# Patient Record
Sex: Male | Born: 1995 | Race: Black or African American | Hispanic: No | Marital: Single | Smoking: Never smoker
Health system: Southern US, Community
[De-identification: ages and names within clinical notes are randomized; demographics above are authoritative.]

## PROBLEM LIST (undated history)

## (undated) HISTORY — PX: ADENOIDECTOMY: SUR15

## (undated) HISTORY — PX: TONSILLECTOMY: SUR1361

---
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
## 2013-03-06 ENCOUNTER — Encounter (HOSPITAL_COMMUNITY): Payer: Self-pay | Admitting: Emergency Medicine

## 2013-03-06 ENCOUNTER — Emergency Department (HOSPITAL_COMMUNITY): Payer: Self-pay

## 2013-03-06 ENCOUNTER — Emergency Department (INDEPENDENT_AMBULATORY_CARE_PROVIDER_SITE_OTHER)
Admission: EM | Admit: 2013-03-06 | Discharge: 2013-03-06 | Disposition: A | Discharge status: Nursing Home (Includes ICF) | Payer: Self-pay | Source: Home / Self Care

## 2013-03-06 ENCOUNTER — Emergency Department (HOSPITAL_COMMUNITY)
Admission: EM | Admit: 2013-03-06 | Discharge: 2013-03-06 | Disposition: A | Discharge status: Home / Self Care | Payer: Self-pay | Attending: Emergency Medicine | Admitting: Emergency Medicine

## 2013-03-06 ENCOUNTER — Emergency Department (INDEPENDENT_AMBULATORY_CARE_PROVIDER_SITE_OTHER): Payer: Self-pay

## 2013-03-06 ENCOUNTER — Encounter (HOSPITAL_COMMUNITY): Payer: Self-pay

## 2013-03-06 DIAGNOSIS — R599 Enlarged lymph nodes, unspecified: Secondary | ICD-10-CM | POA: Insufficient documentation

## 2013-03-06 DIAGNOSIS — J0510 Acute epiglottitis without obstruction: Secondary | ICD-10-CM

## 2013-03-06 DIAGNOSIS — J029 Acute pharyngitis, unspecified: Secondary | ICD-10-CM

## 2013-03-06 DIAGNOSIS — IMO0001 Reserved for inherently not codable concepts without codable children: Secondary | ICD-10-CM | POA: Insufficient documentation

## 2013-03-06 DIAGNOSIS — Z9089 Acquired absence of other organs: Secondary | ICD-10-CM | POA: Insufficient documentation

## 2013-03-06 DIAGNOSIS — R5381 Other malaise: Secondary | ICD-10-CM | POA: Insufficient documentation

## 2013-03-06 DIAGNOSIS — R498 Other voice and resonance disorders: Secondary | ICD-10-CM | POA: Insufficient documentation

## 2013-03-06 DIAGNOSIS — R509 Fever, unspecified: Secondary | ICD-10-CM | POA: Insufficient documentation

## 2013-03-06 DIAGNOSIS — R5383 Other fatigue: Secondary | ICD-10-CM | POA: Insufficient documentation

## 2013-03-06 DIAGNOSIS — R1310 Dysphagia, unspecified: Secondary | ICD-10-CM | POA: Insufficient documentation

## 2013-03-06 LAB — CBC WITH DIFFERENTIAL/PLATELET
Basophils Absolute: 0 10*3/uL (ref 0.0–0.1)
Basophils Relative: 0 % (ref 0–1)
Eosinophils Absolute: 0.2 10*3/uL (ref 0.0–0.7)
MCH: 29.1 pg (ref 26.0–34.0)
MCHC: 35.5 g/dL (ref 30.0–36.0)
Neutrophils Relative %: 66 % (ref 43–77)
Platelets: 248 10*3/uL (ref 150–400)

## 2013-03-06 LAB — COMPREHENSIVE METABOLIC PANEL
ALT: 13 U/L (ref 0–53)
AST: 19 U/L (ref 0–37)
Albumin: 4.6 g/dL (ref 3.5–5.2)
Alkaline Phosphatase: 79 U/L (ref 39–117)
Potassium: 3.9 mEq/L (ref 3.5–5.1)
Sodium: 139 mEq/L (ref 135–145)
Total Protein: 9.3 g/dL — ABNORMAL HIGH (ref 6.0–8.3)

## 2013-03-06 LAB — POCT RAPID STREP A: Streptococcus, Group A Screen (Direct): NEGATIVE

## 2013-03-06 MED ORDER — SODIUM CHLORIDE 0.9 % IV BOLUS (SEPSIS)
1000.0000 mL | Freq: Once | INTRAVENOUS | Status: AC
  Administered 2013-03-06: 1000 mL via INTRAVENOUS

## 2013-03-06 MED ORDER — IOHEXOL 300 MG/ML  SOLN
75.0000 mL | Freq: Once | INTRAMUSCULAR | Status: AC | PRN
  Administered 2013-03-06: 75 mL via INTRAVENOUS

## 2013-03-06 MED ORDER — HYDROCODONE-ACETAMINOPHEN 7.5-500 MG/15ML PO SOLN: 15.0000 mL | Freq: Four times a day (QID) | ORAL | Status: DC | PRN

## 2013-03-06 MED ORDER — CLINDAMYCIN HCL 300 MG PO CAPS: 300.0000 mg | ORAL_CAPSULE | Freq: Three times a day (TID) | ORAL | Status: DC

## 2013-03-06 MED ORDER — SODIUM CHLORIDE 0.9 % IV SOLN
3.0000 g | Freq: Once | INTRAVENOUS | Status: AC
  Administered 2013-03-06: 3 g via INTRAVENOUS
  Filled 2013-03-06: qty 3

## 2013-03-06 NOTE — ED Notes (Signed)
Pt is c/o sore throat x2 weeks Sx include: dysphagia, fever Denies: v/n/d Went to [facility]; strep test negative; given Lidocaine 2% viscous Also took tyle last night  He is alert w/no signs of acute distress.

## 2013-03-06 NOTE — ED Provider Notes (Signed)
History     CSN: [id number]  Arrival date & time 03/06/13  1226   First MD Initiated Contact with Patient 03/06/13 1504      Chief Complaint  Patient presents with  . Sore Throat    (Consider location/radiation/quality/duration/timing/severity/associated sxs/prior treatment) HPI Comments: Patient presents with a two-week history of worsening sore throat. He states it's gotten markedly worse over the last 2 days. He states at this point, he has difficulty controlling his secretions. He states it hurts when he swallows. He denies any shortness of breath. He's had some subjective fevers at home. He denies any nausea vomiting or diarrhea. He was seen in a [facility] earlier when it started and had a negative rapid strep. He was also seen that urgent care Center today and had a soft tissue neck that was positive for possible epiglottitis. He was sent over here for further evaluation. He denies any runny nose congestion or chest congestion. He denies any rashes.  Patient is a 17 y.o. male presenting with pharyngitis.  Sore Throat Pertinent negatives include no chest pain, no abdominal pain, no headaches and no shortness of breath.    History reviewed. No pertinent past medical history.  Past Surgical History  Procedure Laterality Date  . Tonsillectomy    . Adenoidectomy      History reviewed. No pertinent family history.  History  Substance Use Topics  . Smoking status: Never Smoker   . Smokeless tobacco: Not on file  . Alcohol Use: No      Review of Systems  Constitutional: Positive for fever, chills and fatigue. Negative for diaphoresis.  HENT: Positive for sore throat, trouble swallowing and voice change. Negative for congestion, rhinorrhea and sneezing.   Eyes: Negative.   Respiratory: Negative for cough, chest tightness and shortness of breath.   Cardiovascular: Negative for chest pain and leg swelling.  Gastrointestinal: Negative for nausea, vomiting, abdominal pain,  diarrhea and blood in stool.  Genitourinary: Negative for frequency, hematuria, flank pain and difficulty urinating.  Musculoskeletal: Positive for myalgias. Negative for back pain and arthralgias.  Skin: Negative for rash.  Neurological: Negative for dizziness, speech difficulty, weakness, numbness and headaches.    Allergies  Review of patient's allergies indicates no known allergies.  Home Medications   Current Outpatient Rx  Name  Route  Sig  Dispense  Refill  . acetaminophen (TYLENOL) 500 MG tablet   Oral   Take 1,000 mg by mouth every 4 (four) hours as needed for pain.         . clindamycin (CLEOCIN) 300 MG capsule   Oral   Take 1 capsule (300 mg total) by mouth 3 (three) times daily. X 7 days   30 capsule   0   . HYDROcodone-acetaminophen (LORTAB) 7.5-500 MG/15ML solution   Oral   Take 15 mLs by mouth every 6 (six) hours as needed for pain.   120 mL   0     BP 123/71  Pulse 98  Temp(Src) 100.1 F (37.8 C) (Oral)  Resp 16  SpO2 98%  Physical Exam  Constitutional: He is oriented to person, place, and time. He appears well-developed and well-nourished.  HENT:  Head: Normocephalic and atraumatic.  Right Ear: External ear normal.  Left Ear: External ear normal.  Nose: Nose normal.  Patient with erythema to the posterior pharynx. The uvula is midline however there is some fullness to the right peritonsillar area as compared to the left. There is no significant trismus.  Eyes: Pupils  are equal, round, and reactive to light.  Neck: Normal range of motion. Neck supple.  Cardiovascular: Normal rate, regular rhythm and normal heart sounds.   Pulmonary/Chest: Effort normal and breath sounds normal. No respiratory distress. He has no wheezes. He has no rales. He exhibits no tenderness.  No stridor  Abdominal: Soft. Bowel sounds are normal. There is no tenderness. There is no rebound and no guarding.  Musculoskeletal: Normal range of motion. He exhibits no edema.   Lymphadenopathy:    He has cervical adenopathy.  Neurological: He is alert and oriented to person, place, and time.  Skin: Skin is warm and dry. No rash noted.  Psychiatric: He has a normal mood and affect.    ED Course  Procedures (including critical care time)  Results for orders placed during the hospital encounter of 03/06/13  CBC WITH DIFFERENTIAL      Result Value Range   WBC 13.1 (*) 4.0 - 10.5 K/uL   RBC 5.87 (*) 4.22 - 5.81 MIL/uL   Hemoglobin 17.1 (*) 13.0 - 17.0 g/dL   HCT 16.1  09.6 - 04.5 %   MCV 82.1  78.0 - 100.0 fL   MCH 29.1  26.0 - 34.0 pg   MCHC 35.5  30.0 - 36.0 g/dL   RDW 40.9  81.1 - 91.4 %   Platelets 248  150 - 400 K/uL   Neutrophils Relative 66  43 - 77 %   Neutro Abs 8.6 (*) 1.7 - 7.7 K/uL   Lymphocytes Relative 22  12 - 46 %   Lymphs Abs 2.9  0.7 - 4.0 K/uL   Monocytes Relative 11  3 - 12 %   Monocytes Absolute 1.4 (*) 0.1 - 1.0 K/uL   Eosinophils Relative 2  0 - 5 %   Eosinophils Absolute 0.2  0.0 - 0.7 K/uL   Basophils Relative 0  0 - 1 %   Basophils Absolute 0.0  0.0 - 0.1 K/uL  COMPREHENSIVE METABOLIC PANEL      Result Value Range   Sodium 139  135 - 145 mEq/L   Potassium 3.9  3.5 - 5.1 mEq/L   Chloride 98  96 - 112 mEq/L   CO2 25  19 - 32 mEq/L   Glucose, Bld 75  70 - 99 mg/dL   BUN 11  6 - 23 mg/dL   Creatinine, Ser 7.82  0.50 - 1.35 mg/dL   Calcium 95.6  8.4 - 21.3 mg/dL   Total Protein 9.3 (*) 6.0 - 8.3 g/dL   Albumin 4.6  3.5 - 5.2 g/dL   AST 19  0 - 37 U/L   ALT 13  0 - 53 U/L   Alkaline Phosphatase 79  39 - 117 U/L   Total Bilirubin 0.6  0.3 - 1.2 mg/dL   GFR calc non Af Amer >90  >90 mL/min   GFR calc Af Amer >90  >90 mL/min   Dg Neck Soft Tissue  03/06/2013  *RADIOLOGY REPORT*  Clinical Data: Sore throat for the past week.  NECK SOFT TISSUES - 1+ VIEW  Comparison: No priors.  Findings: Lateral views of the soft tissues of the neck demonstrate some soft tissue prominence of the epiglottis.  There is also soft tissue  prominence in the subglottic region, in the expected location of the vocal cords.  Subglottic airway is otherwise normal in appearance on the lateral projection.  Hypopharynx is normal in appearance.  IMPRESSION: 1.  Soft tissue thickening in the epiglottis and immediate  subglottic region (possibly in the area of the vocal cords). Clinical correlation for signs and symptoms of epiglottitis is recommended with consideration for upper endoscopy if warranted.   Original Report Authenticated By: Trudie Reed, M.D.    Ct Soft Tissue Neck W Contrast  03/06/2013  *RADIOLOGY REPORT*  Clinical Data: Sore throat.  History tonsillectomy and adenoidectomy  CT NECK WITH CONTRAST  Technique:  Multidetector CT imaging of the neck was performed with intravenous contrast.  Contrast: 75mL OMNIPAQUE IOHEXOL 300 MG/ML  SOLN  Comparison: Soft tissue neck 03/06/2013  Findings: Mild asymmetry of the right tonsil which is mildly enlarged and  may be due to infection.  No evidence of peritonsillar abscess.  Epiglottis and larynx are normal.  Aryepiglottic folds are normal. No evidence of epiglottitis.  Trachea and upper lobes are normal.  Parotid and submandibular glands are normal bilaterally.  Thyroid is normal.  Mild adenopathy in the neck.  Right level II node measures 14 mm. Left level II node measures 13 mm.  Multiple sub centimeter posterior lymph nodes in the neck.  Prominent submandibular nodes with fatty hila.  Mildly prominent submental nodes.  No acute bony abnormality.  IMPRESSION: Negative for epiglottitis.  Asymmetric soft tissue swelling in the right tonsillar area which may represent acute infection.  No evidence of peritonsillar abscess.  Shotty adenopathy in the neck bilaterally, most likely related to respiratory tract infection.   Original Report Authenticated By: Janeece Riggers, M.D.       1. Pharyngitis       MDM  Patient no evidence of epiglottitis. There is no evidence of peritonsillar abscess. Patient  is well-appearing with new respiratory compromise. I discussed the case with Dr. Annalee Genta with ENT who feels comfortable with the patient being discharged. We will start him on clindamycin and Lortab elixir. After Annalee Genta advised the patient to followup with him in the office on Monday or Tuesday if his symptoms are not improving. I also advised the patient to return here to the emergency department if he has any worsening symptoms over the weekend.        Rolan Bucco, MD 03/06/13 1710

## 2013-03-06 NOTE — ED Provider Notes (Signed)
Patient Demographics  Jeff Vega, is a 17 y.o. male  ZOX:[id number]  WJX:[id number]  DOB - 10-18-1996  Chief Complaint  Patient presents with  . Sore Throat        Subjective:   Jeff Vega today  is here with 2 week history of sore throat with odynophagia, low-grade fevers, now having problems swallowing his saliva at times. Denies any recent sexual contact, no body aches. Does not perform oral sex.  Objective:    Filed Vitals:   03/06/13 1132  BP: 131/84  Pulse: 117  Temp: 100.9 F (38.3 C)  TempSrc: Oral  Resp: 20  SpO2: 100%     Exam  Awake Alert, Oriented X 3, No new F.N deficits, Normal affect [place].AT,PERRAL Supple Neck,No JVD, No cervical lymphadenopathy appriciated.  Tender bilateral lymphadenopathy in the cervical lymph node area, right side more than left, on throat exam evidence of right-sided pharyngitis and swelling. Symmetrical Chest wall movement, Good air movement bilaterally, CTAB RRR,No Gallops,Rubs or new Murmurs, No Parasternal Heave +ve B.Sounds, Abd Soft, Non tender, No organomegaly appriciated, No rebound - guarding or rigidity. No Cyanosis, Clubbing or edema, No new Rash or bruise      Data Review   CBC No results found for this basename: WBC, HGB, HCT, PLT, MCV, MCH, MCHC, RDW, NEUTRABS, LYMPHSABS, MONOABS, EOSABS, BASOSABS, BANDABS, BANDSABD,  in the last 168 hours  Chemistries   No results found for this basename: NA, K, CL, CO2, GLUCOSE, BUN, CREATININE, GFRCGP, CALCIUM, MG, AST, ALT, ALKPHOS, BILITOT,  in the last 168 hours ------------------------------------------------------------------------------------------------------------------ No results found for this basename: HGBA1C,  in the last 72 hours ------------------------------------------------------------------------------------------------------------------ No results found for this basename: CHOL, HDL, LDLCALC, TRIG, CHOLHDL, LDLDIRECT,  in the last 72  hours ------------------------------------------------------------------------------------------------------------------ No results found for this basename: TSH, T4TOTAL, FREET3, T3FREE, THYROIDAB,  in the last 72 hours ------------------------------------------------------------------------------------------------------------------ No results found for this basename: VITAMINB12, FOLATE, FERRITIN, TIBC, IRON, RETICCTPCT,  in the last 72 hours  Coagulation profile  No results found for this basename: INR, PROTIME,  in the last 168 hours     Prior to Admission medications   Medication Sig Start Date End Date Taking? Authorizing Tarrie Mcmichen  lidocaine (XYLOCAINE) 2 % solution Take 20 mLs by mouth as needed for pain.   Yes Historical Oluwadamilola Rosamond, MD     Assessment & Plan   Epiglottitis with low-grade fevers, chest x-ray evidence of the same, patient is having some problems swallowing his saliva, running low-grade fevers, will be transported to the ER, will need IV antibiotics, abscess ruled out, he has had 2 week history of unilateral throat pain and odynophagia. Please seek ENT evaluation.    Leroy Sea M.D on 03/06/2013 at 12:08 PM   Leroy Sea, MD 03/06/13 1210

## 2013-03-06 NOTE — ED Notes (Signed)
Pt states he's had some sore throat off and on for a couple weeks.  Pt thought he was dehydrated and that it would go away.  2 days ago pain became worse.  Pt was sent here from [facility].  Pt's strep screen was negative.  X-ray from [facility] concerning for epiglottitis.  Pt denies SOB.  Pt in NAD at this time.

## 2013-03-08 LAB — POCT INFECTIOUS MONO SCREEN: Mono Screen: NEGATIVE

## 2014-01-01 ENCOUNTER — Emergency Department (HOSPITAL_COMMUNITY)
Admission: EM | Admit: 2014-01-01 | Discharge: 2014-01-01 | Disposition: A | Discharge status: Home / Self Care | Payer: BC Managed Care – PPO | Attending: Emergency Medicine | Admitting: Emergency Medicine

## 2014-01-01 ENCOUNTER — Encounter (HOSPITAL_COMMUNITY): Payer: Self-pay | Admitting: Emergency Medicine

## 2014-01-01 ENCOUNTER — Emergency Department (HOSPITAL_COMMUNITY): Payer: BC Managed Care – PPO

## 2014-01-01 DIAGNOSIS — R079 Chest pain, unspecified: Secondary | ICD-10-CM

## 2014-01-01 MED ORDER — IBUPROFEN 400 MG PO TABS
600.0000 mg | ORAL_TABLET | Freq: Once | ORAL | Status: AC
  Administered 2014-01-01: 600 mg via ORAL
  Filled 2014-01-01 (×2): qty 1

## 2014-01-01 MED ORDER — OXYCODONE-ACETAMINOPHEN 5-325 MG PO TABS
2.0000 | ORAL_TABLET | Freq: Once | ORAL | Status: AC
  Administered 2014-01-01: 2 via ORAL
  Filled 2014-01-01: qty 2

## 2014-01-01 NOTE — ED Notes (Signed)
Pt. woke up this morning with mid chest pain and SOB , denies nausea or diaphoresis .

## 2014-01-01 NOTE — Discharge Instructions (Signed)
Chest Pain (Nonspecific) °It is often hard to give a specific diagnosis for the cause of chest pain. There is always a chance that your pain could be related to something serious, such as a heart attack or a blood clot in the lungs. You need to follow up with your caregiver for further evaluation. °CAUSES  °· Heartburn. °· Pneumonia or bronchitis. °· Anxiety or stress. °· Inflammation around your heart (pericarditis) or lung (pleuritis or pleurisy). °· A blood clot in the lung. °· A collapsed lung (pneumothorax). It can develop suddenly on its own (spontaneous pneumothorax) or from injury (trauma) to the chest. °· Shingles infection (herpes zoster virus). °The chest wall is composed of bones, muscles, and cartilage. Any of these can be the source of the pain. °· The bones can be bruised by injury. °· The muscles or cartilage can be strained by coughing or overwork. °· The cartilage can be affected by inflammation and become sore (costochondritis). °DIAGNOSIS  °Lab tests or other studies, such as X-rays, electrocardiography, stress testing, or cardiac imaging, may be needed to find the cause of your pain.  °TREATMENT  °· Treatment depends on what may be causing your chest pain. Treatment may include: °· Acid blockers for heartburn. °· Anti-inflammatory medicine. °· Pain medicine for inflammatory conditions. °· Antibiotics if an infection is present. °· You may be advised to change lifestyle habits. This includes stopping smoking and avoiding alcohol, caffeine, and chocolate. °· You may be advised to keep your head raised (elevated) when sleeping. This reduces the chance of acid going backward from your stomach into your esophagus. °· Most of the time, nonspecific chest pain will improve within 2 to 3 days with rest and mild pain medicine. °HOME CARE INSTRUCTIONS  °· If antibiotics were prescribed, take your antibiotics as directed. Finish them even if you start to feel better. °· For the next few days, avoid physical  activities that bring on chest pain. Continue physical activities as directed. °· Do not smoke. °· Avoid drinking alcohol. °· Only take over-the-counter or prescription medicine for pain, discomfort, or fever as directed by your caregiver. °· Follow your caregiver's suggestions for further testing if your chest pain does not go away. °· Keep any follow-up appointments you made. If you do not go to an appointment, you could develop lasting (chronic) problems with pain. If there is any problem keeping an appointment, you must call to reschedule. °SEEK MEDICAL CARE IF:  °· You think you are having problems from the medicine you are taking. Read your medicine instructions carefully. °· Your chest pain does not go away, even after treatment. °· You develop a rash with blisters on your chest. °SEEK IMMEDIATE MEDICAL CARE IF:  °· You have increased chest pain or pain that spreads to your arm, neck, jaw, back, or abdomen. °· You develop shortness of breath, an increasing cough, or you are coughing up blood. °· You have severe back or abdominal pain, feel nauseous, or vomit. °· You develop severe weakness, fainting, or chills. °· You have a fever. °THIS IS AN EMERGENCY. Do not wait to see if the pain will go away. Get medical help at once. Call your local emergency services (911 in [place] Do not drive yourself to the hospital. °MAKE SURE YOU:  °· Understand these instructions. °· Will watch your condition. °· Will get help right away if you are not doing well or get worse. °Document Released: 09/25/2005 Document Revised: 03/09/2012 Document Reviewed: 07/21/2008 °ExitCare® Patient Information ©2014 [employer],   LLC. ° ° ° °Emergency Department Resource Guide °1) Find a Doctor and Pay Out of Pocket °Although you won't have to find out who is covered by your insurance plan, it is a good idea to ask around and get recommendations. You will then need to call the office and see if the doctor you have chosen will accept you as a new  patient and what types of options they offer for patients who are self-pay. Some doctors offer discounts or will set up payment plans for their patients who do not have insurance, but you will need to ask so you aren't surprised when you get to your appointment. ° °2) Contact Your Local Health Department °Not all health departments have doctors that can see patients for sick visits, but many do, so it is worth a call to see if yours does. If you don't know where your local health department is, you can check in your phone book. The CDC also has a tool to help you locate your state's health department, and many state websites also have listings of all of their local health departments. ° °3) Find a Walk-in Clinic °If your illness is not likely to be very severe or complicated, you may want to try a walk in clinic. These are popping up all over the country in pharmacies, drugstores, and shopping centers. They're usually staffed by nurse practitioners or physician assistants that have been trained to treat common illnesses and complaints. They're usually fairly quick and inexpensive. However, if you have serious medical issues or chronic medical problems, these are probably not your best option. ° °No Primary Care Doctor: °- Call [facility] at  832-8000 - they can help you locate a primary care doctor that  accepts your insurance, provides certain services, etc. °- Physician Referral Service- 1-800-533-3463 ° °Chronic Pain Problems: °Organization         Address  Phone   Notes  °[facility]  (336) 297-2271 Patients need to be referred by their primary care doctor.  ° °Medication Assistance: °Organization         Address  Phone   Notes  °[place] [facility] [address] °[place], [place] [address] (336) 641-8030 --Must be a resident of [place] °-- Must have NO insurance coverage whatsoever (no Medicaid/ Medicare, etc.) °-- The pt. MUST have a primary  care doctor that directs their care regularly and follows them in the community °  °[facility]  (866) 331-1348   °[employer]  (888) 892-1162   ° °Agencies that provide inexpensive medical care: °Organization         Address  Phone   Notes  °[facility]  (336) 832-8035   °[facility]    (336) 832-7272   °[facility] [address] °[place], [place] [address] (336) 832-4777   °[facility] [address], °[place] (336) 271-4999   °[facility]    (336) 373-0678   °[facility]    (336) 272-1050   °[facility] ° [address], [place] Phone:  (336) 832-4444, Fax:  (336) 832-4440 Hours of Operation:  9 am - 6 pm, M-F.  Also accepts Medicaid/Medicare and self-pay.  °[facility] ° [address], [place] Phone: (336) 832-3150, Fax: (336) 832-3151. Hours of Operation:  8:30 am - 5:30 pm, M-F.  Also accepts Medicaid and self-pay.  °  [facility] [place] [address], [place] Phone: (336) 878-6027   °[facility] [address], [place], [place] (336)723-1848, Ext. 123 Mondays & Thursdays: 7-9 AM.  First 15 patients are seen on a first come, first serve basis. °  ° °Medicaid-accepting [place] Providers: ° °Organization         Address  Phone   Notes  °[facility] [address], [place] (336) 641-2100 Also accepts self-pay patients.  °[facility] [address], [place] ° (336) 856-9996   °[facility] [address], [place] (336) 288-8857   °[facility] [address] [place] [address], [place] (336) 299-7000   °Veita Bland [address], [place]  ° (336) 373-1557 Only accepts [place] Access Medicaid patients after they have their name applied to their card.  ° °Self-Pay (no insurance) in [place]: ° °Organization         Address  Phone   Notes  °Sickle Cell Patients, [facility] [address], [place] (336) 832-1970   °[facility] [address], [place] (336) 832-4400   °[facility] ° [address], [place] (336) 992-4800   °[facility]/Dr. Osei-Bonsu ° [address] [place] [address], [place] or [address], [place] (336) 841-8500 Phone number for both [place] and [place] locations is the same.  °[facility] [address], [place] (336) 299-0000   °[facility] [address] [place] [address], [place] or [address] (336) 852-7530 °(336) 878-2260   °[facility] [address], [place] (336) 350-1642, phone; (336) 294-5005, fax Sees patients 1st and 3rd Saturday of every month.  Must not qualify for public or private insurance (i.e. Medicaid, Medicare, [place] Health Choice, Veterans' Benefits) • Household income should be no more than 200% of the poverty level •The clinic cannot treat you if you are pregnant or think you are pregnant • Sexually transmitted diseases are not treated at the clinic.  ° ° °Dental Care: °Organization         Address  Phone  Notes  °[facility] [address], [place] (336) 641-6152 Accepts children up to age 21 who are enrolled in Medicaid or [place] Health Choice; pregnant women with a Medicaid card; and children who have applied for Medicaid or [place] Health Choice, but were declined, whose parents can pay a reduced fee at time of service.  °[facility] [place]  [address], [place] (336) 641-7733 Accepts children up to age 21 who are enrolled in Medicaid or [place] Health Choice; pregnant women with a Medicaid card; and children who have applied for Medicaid or [place] Health Choice, but were declined, whose parents can  pay a reduced fee at time of service.  °[facility] ° [address], [place] (336) 641-4533 Patients are seen by appointment only. Walk-ins are not accepted. [facility] will see patients 18 years of age and older. °Monday - Tuesday (8am-5pm) °Most Wednesdays (8:30-5pm) °$30 per visit, cash only  °[facility] ° [address], [place] (336) 641-4533 Patients are seen by appointment only. Walk-ins are not accepted. [facility] will see patients 18 years of   age and older. °One Wednesday Evening (Monthly: Volunteer Based).  $30 per visit, cash only  °[facility]  (919) 537-3737 for adults; Children under age 4, call Graduate Pediatric Dentistry at (919) [phone]. Children aged 4-14, please call (919) [phone] to request a pediatric application. ° Dental services are provided in all areas of dental care including fillings, crowns and bridges, complete and partial dentures, implants, gum treatment, root canals, and extractions. Preventive care is also provided. Treatment is provided to both adults and children. °Patients are selected via a lottery and there is often a waiting list. °  °[facility] [address], °[place] ° (336) 763-8833 [email or web address] °  °[facility] [address], [place], [place] (336)723-1848, Ext. 123 Second and Fourth Thursday of each month, opens at 6:30 AM; Clinic ends at 9 AM.  Patients are seen on a first-come first-served basis, and a limited number are seen during each clinic.  ° °[facility] ° [address], [place], [place] (336) 723-7904   Eligibility Requirements °You must have lived in [place], [place], or [place] counties for at least the last three months. °  You cannot be eligible for state or federal sponsored healthcare insurance, including Veterans Administration, Medicaid, or Medicare. °  You generally cannot be eligible for healthcare  insurance through your employer.  °  How to apply: °Eligibility screenings are held every Tuesday and Wednesday afternoon from 1:00 pm until 4:00 pm. You do not need an appointment for the interview!  °[facility] [address], [place], [place] 336-631-2330   °[facility]  336-342-8273   °[facility]  336-703-3100   °[facility]  336-570-6415   ° °Behavioral Health Resources in the Community: °Intensive Outpatient Programs °Organization         Address  Phone  Notes  °[facility] [address], [place], [place] 336-878-6098   °[facility] [address], [place], [place] 336-832-9800   °[facility] [address], [place], [place] ° 336-882-2125   °[facility] [address],  °[place], [place] 1-800-853-5163 or 336-641-4981   °Substance Abuse Resources °Organization         Address  Phone  Notes  °[facility]  336-882-2125   °[facility]  336-784-9470   °[facility]  336-285-9073   °[facility]  336-845-3988   °Residential & Outpatient Substance Abuse Program  1-800-659-3381   °Psychological Services °Organization         Address  Phone  Notes  °[facility]  336- 832-9600   °[facility]  336- 378-7881   °[facility] [address], [place] 1-800-853-5163 or 336-641-4981   ° °Mobile Crisis Teams °Organization         Address  Phone  Notes  °[facility], Mobile Crisis Care Unit  1-877-626-1772   °Assertive °[facility] ° [address] [place], [place] 336-834-9664   °Sharon DeEsch [address] °[place] 336-554-5454   ° °Self-Help/Support Groups °Organization         Address  Phone             Notes  °[facility] - variety of support groups  336- 373-1402 Call for more information  °Narcotics Anonymous (NA),  [facility] [address], °[place]  2   meetings at this location  ° °Residential Treatment Programs °Organization         Address  Phone  Notes  °[facility] [address],    °[place]  1-866-801-8205   °[facility] ° [address], [place], [place] 704-293-8524   °[facility] [address], [place] 336-845-3988 Admissions: 8am-3pm M-F  °[facility] [address]    °[place], [place] 336-841-1104   °[facility] [address], [place], [place] 336-379-7146   °[facility] [address]  °[place], [place] 336-285-9073   °[facility] Intensive Outpatient [address], [place], [place] 336-852-3033   °[facility] [address]  °[place], [place] 1-877-615-2722 or 336-784-9470   °[facility] [address] [place], [place] 336-227-7417 Accepts Medicaid  °[facility] [address]  °[place] 1-800-659-3381 Substance Abuse/Addiction Treatment  ° °[place] Behavioral Health Resources °Organization         Address  Phone  Notes  °[facility]  (888) 581-9988   °Julie Brannon, PhD [address] [place], [place]   (336) 349-5553 or (336) 951-0000   °[facility]   [address] °[place], [place] (336) 349-4454   °[facility] [address], [place], [place] (336) 342-8316 Insurance/Medicaid/sponsorship through [facility]  °[facility] [address]                                    [place], [place] (336) 342-8316 Therapy/tele-psych/case  °[facility] [address]  ° [place], [place] (336) 349-2233    °Dr. Arfeen  (336) 349-4544   °[facility] [place]  [employer] [place] [facility] 1) [address], [place] °2) [address], [place] °3)  [address], [place] (336) 349-3220 °(336) 342-7768 ° °(336) 342-8140    °[place] [facility] (336) 342-1394 or (336) 342-3537 (After Hours)    ° ° ° °

## 2014-01-01 NOTE — ED Notes (Signed)
Pt awake and getting dressed this am, bent over to pick something up and felt a sudden sharp pain in L chest, describes as: constant, same now as at onset, worse with movement, worse with inspiration, pressure & squeezing, 8/10, alert, NAD, calm, interactive, resps e/u, speaking in clear complete sentences. LS CTA. (denies: nvd, fever, cold sx, recent illness, dizziness, sore throat, palpitations or fluttering, numbness or tingling), admits to some (vague): sob, cough, congestion and pain worse with inspiration & movement. No meds [facility]

## 2014-01-01 NOTE — ED Provider Notes (Signed)
CSN: [id number]     Arrival date & time 01/01/14  4098 History   First MD Initiated Contact with Patient 01/01/14 0703     Chief Complaint  Patient presents with  . Chest Pain   (Consider location/radiation/quality/duration/timing/severity/associated sxs/prior Treatment) HPI 18 year old male with chest pain. Symptom onset early this morning between 5 and 5:30. Patient was bending down and as he stood back up he had a sudden onset of sharp pain in his left chest. Pain is left anterior chest and left axilla. Is worse with movement, deep inspiration and coughing. Does not radiate. No shortness of breath. No fevers or chills. No unusual leg pain or swelling. No history of similar symptoms. Patient is otherwise healthy. Nonsmoker. Denies drug use.  History reviewed. No pertinent past medical history. Past Surgical History  Procedure Laterality Date  . Tonsillectomy    . Adenoidectomy     No family history on file. History  Substance Use Topics  . Smoking status: Never Smoker   . Smokeless tobacco: Not on file  . Alcohol Use: No    Review of Systems  All systems reviewed and negative, other than as noted in HPI.  Allergies  Review of patient's allergies indicates no known allergies.  Home Medications  No current outpatient prescriptions on file. BP 124/89  Pulse 81  Temp(Src) 98.7 F (37.1 C) (Oral)  Resp 18  Ht 5\' 11"  (1.803 m)  Wt 235 lb (106.595 kg)  BMI 32.79 kg/m2  SpO2 98% Physical Exam  Nursing note and vitals reviewed. Constitutional: He appears well-developed and well-nourished. No distress.  HENT:  Head: Normocephalic and atraumatic.  Eyes: Conjunctivae are normal. Right eye exhibits no discharge. Left eye exhibits no discharge.  Neck: Neck supple.  Cardiovascular: Normal rate, regular rhythm and normal heart sounds.  Exam reveals no gallop and no friction rub.   No murmur heard. Pulmonary/Chest: Effort normal and breath sounds normal. No respiratory distress.   Abdominal: Soft. He exhibits no distension. There is no tenderness.  Musculoskeletal: He exhibits no edema and no tenderness.  Lower extremities symmetric as compared to each other. No calf tenderness. Negative Homan's. No palpable cords.   Neurological: He is alert.  Skin: Skin is warm and dry.  Psychiatric: He has a normal mood and affect. His behavior is normal. Thought content normal.    ED Course  Procedures (including critical care time) Labs Review Labs Reviewed - No data to display Imaging Review Dg Chest 2 View  01/01/2014   CLINICAL DATA:  Dyspnea on exertion, left-sided chest pain  EXAM: CHEST  2 VIEW  COMPARISON:  None.  FINDINGS: The lungs are clear and negative for focal airspace consolidation, pulmonary edema or suspicious pulmonary nodule. No pleural effusion or pneumothorax. Cardiac and mediastinal contours are within normal limits. No acute fracture or lytic or blastic osseous lesions. The visualized upper abdominal bowel gas pattern is unremarkable.  IMPRESSION: No active cardiopulmonary disease.   Electronically Signed   By: Malachy Moan M.D.   On: 01/01/2014 07:52    EKG Interpretation    Date/Time:  Saturday January 01 2014 06:15:58 EST Ventricular Rate:  76 PR Interval:  156 QRS Duration: 104 QT Interval:  370 QTC Calculation: 416 R Axis:   82 Text Interpretation:  Normal sinus rhythm with sinus arrhythmia Normal ECG No old tracing to compare Confirmed by Xzayvion Vaeth  MD, Bentli Llorente ([id number]) on 01/01/2014 7:27:08 AM            MDM   1.  Chest pain     17yM with CP. Very atypical for ACS. EKG unremarkable.  Doubt PE. Some concern for spontaneous pneumothorax with acute onset, pleuritic nature and perhaps decreased breath sounds L side. Will XR. If negative, my suspicion for an emergent process is very low. Possibly musculoskeletal with worsened symptoms with L shoulder movement. Pain meds. Reassess.   Pt's x-ray was personally reviewed. No acute process.    Imaging neg. No new complaints. Return precautions discussed. Outpt FU.     Raeford RazorStephen Dulcey Riederer, MD 01/02/14 [phone]

## 2014-01-01 NOTE — ED Notes (Signed)
Patient transported to X-ray 

## 2014-07-20 ENCOUNTER — Emergency Department (INDEPENDENT_AMBULATORY_CARE_PROVIDER_SITE_OTHER): Payer: Self-pay

## 2014-07-20 ENCOUNTER — Encounter (HOSPITAL_COMMUNITY): Payer: Self-pay | Admitting: Emergency Medicine

## 2014-07-20 ENCOUNTER — Emergency Department (HOSPITAL_COMMUNITY)
Admission: EM | Admit: 2014-07-20 | Discharge: 2014-07-20 | Disposition: A | Discharge status: Home / Self Care | Payer: Self-pay | Source: Home / Self Care | Attending: Family Medicine | Admitting: Family Medicine

## 2014-07-20 DIAGNOSIS — M7989 Other specified soft tissue disorders: Secondary | ICD-10-CM

## 2014-07-20 DIAGNOSIS — R0789 Other chest pain: Secondary | ICD-10-CM

## 2014-07-20 LAB — CBC WITH DIFFERENTIAL/PLATELET
BASOS ABS: 0 10*3/uL (ref 0.0–0.1)
BASOS PCT: 1 % (ref 0–1)
EOS PCT: 5 % (ref 0–5)
Eosinophils Absolute: 0.3 10*3/uL (ref 0.0–0.7)
HEMATOCRIT: 45.2 % (ref 39.0–52.0)
Hemoglobin: 15.2 g/dL (ref 13.0–17.0)
LYMPHS PCT: 50 % — AB (ref 12–46)
Lymphs Abs: 3.3 10*3/uL (ref 0.7–4.0)
MCH: 29 pg (ref 26.0–34.0)
MCHC: 33.6 g/dL (ref 30.0–36.0)
MCV: 86.3 fL (ref 78.0–100.0)
MONO ABS: 0.8 10*3/uL (ref 0.1–1.0)
Monocytes Relative: 12 % (ref 3–12)
NEUTROS ABS: 2.1 10*3/uL (ref 1.7–7.7)
Neutrophils Relative %: 32 % — ABNORMAL LOW (ref 43–77)
PLATELETS: 239 10*3/uL (ref 150–400)
RBC: 5.24 MIL/uL (ref 4.22–5.81)
RDW: 13.3 % (ref 11.5–15.5)
WBC: 6.5 10*3/uL (ref 4.0–10.5)

## 2014-07-20 LAB — POCT I-STAT, CHEM 8
BUN: 13 mg/dL (ref 6–23)
CALCIUM ION: 1.21 mmol/L (ref 1.12–1.23)
CHLORIDE: 104 meq/L (ref 96–112)
Creatinine, Ser: 1.2 mg/dL (ref 0.50–1.35)
Glucose, Bld: 87 mg/dL (ref 70–99)
HEMATOCRIT: 47 % (ref 39.0–52.0)
Hemoglobin: 16 g/dL (ref 13.0–17.0)
Potassium: 4 mEq/L (ref 3.7–5.3)
SODIUM: 139 meq/L (ref 137–147)
TCO2: 26 mmol/L (ref 0–100)

## 2014-07-20 LAB — D-DIMER, QUANTITATIVE (NOT AT ARMC)

## 2014-07-20 NOTE — ED Provider Notes (Signed)
CSN: [id number]     Arrival date & time 07/20/14  4098 History   First MD Initiated Contact with Patient 07/20/14 1014     Chief Complaint  Patient presents with  . Arm Problem   (Consider location/radiation/quality/duration/timing/severity/associated sxs/prior Treatment) HPI Comments: 18 year old male presents for evaluation of right arm swelling for 2 weeks. This started about a week days after he started his new job, he works for [employer], he has to do a lot of heavy work with his arms. He says that he has always had some mild intermittent pains in his right arm, but it has become constant for the past 2 weeks. He feels the swelling starting in her shoulder and going through his hand. He denies any numbness in the hand or any specific injury. No personal or family history of DVT or PE. No recent travel. He also admits to chest pain and shortness of breath, intermittently, for the past month. This happens a few times daily and lasts for about 5 minutes. He describes the chest pain as occurring in his left chest, feeling like pressure and stabbing sensation. He has associated shortness of breath when this occurs. It happens 3-4 times daily, every single day, for the last month. He had one episode of vomiting a few days ago but otherwise has not had any nausea or vomiting. This vomiting was not associated with chest pain. He denies any weakness, numbness, leg swelling, headache, visual changes. No family history of heart problems at a young age. The last time he had any chest pain was yesterday, no chest pain whatsoever right now.   History reviewed. No pertinent past medical history. Past Surgical History  Procedure Laterality Date  . Tonsillectomy    . Adenoidectomy     History reviewed. No pertinent family history. History  Substance Use Topics  . Smoking status: Never Smoker   . Smokeless tobacco: Not on file  . Alcohol Use: No    Review of Systems  Constitutional: Negative for  fever, chills and fatigue.  HENT: Negative for sore throat.   Eyes: Negative for visual disturbance.  Respiratory: Positive for chest tightness and shortness of breath. Negative for cough and wheezing.   Cardiovascular: Positive for chest pain. Negative for palpitations and leg swelling.  Gastrointestinal: Positive for nausea and vomiting. Negative for abdominal pain, diarrhea and constipation.  Genitourinary: Negative for dysuria, urgency, frequency and hematuria.  Musculoskeletal: Negative for arthralgias, myalgias, neck pain and neck stiffness.       Right arm swelling  Skin: Negative for rash.  Neurological: Negative for dizziness, weakness and light-headedness.  All other systems reviewed and are negative.   Allergies  Review of patient's allergies indicates no known allergies.  Home Medications   Prior to Admission medications   Not on File   BP 120/84  Pulse 61  Temp(Src) 98.1 F (36.7 C) (Oral)  SpO2 100% Physical Exam  Nursing note and vitals reviewed. Constitutional: He is oriented to person, place, and time. He appears well-developed and well-nourished. No distress.  HENT:  Head: Normocephalic.  Neck: Normal range of motion. Neck supple.  Cardiovascular: Normal rate, regular rhythm, normal heart sounds, intact distal pulses and normal pulses.  Exam reveals no gallop and no friction rub.   No murmur heard. Pulses:      Radial pulses are 2+ on the right side, and 2+ on the left side.  Pulmonary/Chest: Effort normal. No respiratory distress. He has no wheezes. He has no rales.  Musculoskeletal:  Right forearm: He exhibits tenderness ( mild, in the forearm musculature).  Arms  are measured at equal places bilaterally and the right arm is consistently 1 cm greater diameter than the left. Right hand is noticeably swollen.  Neurological: He is alert and oriented to person, place, and time. Coordination normal.  Skin: Skin is warm and dry. No rash noted. He is not  diaphoretic.  Psychiatric: He has a normal mood and affect. Judgment normal.    ED Course  ED EKG  Date/Time: 07/20/2014 12:49 PM Performed by: Autumn MessingBAKER, Gaytha Raybourn, H Authorized by: Autumn MessingBAKER, Minas Bonser, H Rhythm: sinus rhythm Rate: normal QRS axis: normal Conduction: conduction normal ST Segments: ST segments normal T Waves: T waves normal Other: no other findings Clinical impression: normal ECG   (including critical care time) Labs Review Labs Reviewed  CBC WITH DIFFERENTIAL - Abnormal; Notable for the following:    Neutrophils Relative % 32 (*)    Lymphocytes Relative 50 (*)    All other components within normal limits  D-DIMER, QUANTITATIVE  POCT I-STAT, CHEM 8    Imaging Review Dg Chest 2 View  07/20/2014   CLINICAL DATA:  Chest pain, short of breath  EXAM: CHEST  2 VIEW  COMPARISON:  Prior chest x-ray 01/01/2014  FINDINGS: The lungs are clear and negative for focal airspace consolidation, pulmonary edema or suspicious pulmonary nodule. No pleural effusion or pneumothorax. Cardiac and mediastinal contours are within normal limits. No acute fracture or lytic or blastic osseous lesions. The visualized upper abdominal bowel gas pattern is unremarkable.  IMPRESSION: No active cardiopulmonary disease.   Electronically Signed   By: Malachy MoanHeath  McCullough M.D.   On: 07/20/2014 11:51     MDM   1. Arm swelling   2. Other chest pain    Ice, elevation of arm for now.  Referred to cardiology for outpatient workup of CP, will go to ED if pain returns        Graylon GoodZachary H Khaled Herda, PA-C 07/20/14 1249

## 2014-07-20 NOTE — Discharge Instructions (Signed)

## 2014-07-20 NOTE — ED Notes (Signed)
C/o pain and swelling in right arm (axilla to hand) x 2 + weeks. 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

## 2014-07-22 NOTE — ED Provider Notes (Signed)
Medical screening examination/treatment/procedure(s) were performed by a resident physician or non-physician practitioner and as the supervising physician I was immediately available for consultation/collaboration.  Bricelyn Freestone, MD    Donnie Gedeon S Jarvis Sawa, MD 07/22/14 0727 

## 2014-07-26 ENCOUNTER — Encounter: Payer: Self-pay | Admitting: Cardiology

## 2014-07-26 NOTE — Progress Notes (Signed)
     HPI: 18 yo male for evaluation of chest pain. Seen in ER 7/15 with chest pain; chest xray negative; Hgb, Ddimer and K normal; lymphocytosis noted.  No current outpatient prescriptions on file.   No current facility-administered medications for this visit.    No Known Allergies  No past medical history on file.  Past Surgical History  Procedure Laterality Date  . Tonsillectomy    . Adenoidectomy      History   Social History  . Marital Status: Single    Spouse Name: N/A    Number of Children: N/A  . Years of Education: N/A   Occupational History  . Not on file.   Social History Main Topics  . Smoking status: Never Smoker   . Smokeless tobacco: Not on file  . Alcohol Use: No  . Drug Use: No  . Sexual Activity: Not on file   Other Topics Concern  . Not on file   Social History Narrative  . No narrative on file    No family history on file.  ROS: no fevers or chills, productive cough, hemoptysis, dysphasia, odynophagia, melena, hematochezia, dysuria, hematuria, rash, seizure activity, orthopnea, PND, pedal edema, claudication. Remaining systems are negative.  Physical Exam:   There were no vitals taken for this visit.  General:  Well developed/well nourished in NAD Skin warm/dry Patient not depressed No peripheral clubbing Back-normal HEENT-normal/normal eyelids Neck supple/normal carotid upstroke bilaterally; no bruits; no JVD; no thyromegaly chest - CTA/ normal expansion CV - RRR/normal S1 and S2; no murmurs, rubs or gallops;  PMI nondisplaced Abdomen -NT/ND, no HSM, no mass, + bowel sounds, no bruit 2+ femoral pulses, no bruits Ext-no edema, chords, 2+ DP Neuro-grossly nonfocal  ECG 07/20/14-sinus bradycardia.   This encounter was created in error - please disregard.

## 2014-09-02 ENCOUNTER — Emergency Department (HOSPITAL_COMMUNITY): Payer: Self-pay

## 2014-09-02 ENCOUNTER — Encounter (HOSPITAL_COMMUNITY): Payer: Self-pay | Admitting: Emergency Medicine

## 2014-09-02 ENCOUNTER — Emergency Department (HOSPITAL_COMMUNITY)
Admission: EM | Admit: 2014-09-02 | Discharge: 2014-09-02 | Disposition: A | Discharge status: Home / Self Care | Payer: Self-pay | Attending: Emergency Medicine | Admitting: Emergency Medicine

## 2014-09-02 DIAGNOSIS — S6000XA Contusion of unspecified finger without damage to nail, initial encounter: Secondary | ICD-10-CM | POA: Insufficient documentation

## 2014-09-02 DIAGNOSIS — S6990XA Unspecified injury of unspecified wrist, hand and finger(s), initial encounter: Secondary | ICD-10-CM | POA: Insufficient documentation

## 2014-09-02 DIAGNOSIS — S60012A Contusion of left thumb without damage to nail, initial encounter: Secondary | ICD-10-CM

## 2014-09-02 DIAGNOSIS — S60112A Contusion of left thumb with damage to nail, initial encounter: Secondary | ICD-10-CM

## 2014-09-02 DIAGNOSIS — Y9289 Other specified places as the place of occurrence of the external cause: Secondary | ICD-10-CM | POA: Insufficient documentation

## 2014-09-02 DIAGNOSIS — Y9389 Activity, other specified: Secondary | ICD-10-CM | POA: Insufficient documentation

## 2014-09-02 DIAGNOSIS — W230XXA Caught, crushed, jammed, or pinched between moving objects, initial encounter: Secondary | ICD-10-CM | POA: Insufficient documentation

## 2014-09-02 MED ORDER — HYDROCODONE-ACETAMINOPHEN 5-325 MG PO TABS
2.0000 | ORAL_TABLET | Freq: Once | ORAL | Status: AC
  Administered 2014-09-02: 2 via ORAL
  Filled 2014-09-02: qty 2

## 2014-09-02 MED ORDER — HYDROCODONE-ACETAMINOPHEN 5-325 MG PO TABS: 2.0000 | ORAL_TABLET | ORAL | Status: DC | PRN

## 2014-09-02 NOTE — Discharge Instructions (Signed)
Cryotherapy °Cryotherapy means treatment with cold. Ice or gel packs can be used to reduce both pain and swelling. Ice is the most helpful within the first 24 to 48 hours after an injury or flare-up from overusing a muscle or joint. Sprains, strains, spasms, burning pain, shooting pain, and aches can all be eased with ice. Ice can also be used when recovering from surgery. Ice is effective, has very few side effects, and is safe for most people to use. °PRECAUTIONS  °Ice is not a safe treatment option for people with: °· Raynaud phenomenon. This is a condition affecting small blood vessels in the extremities. Exposure to cold may cause your problems to return. °· Cold hypersensitivity. There are many forms of cold hypersensitivity, including: °¨ Cold urticaria. Red, itchy hives appear on the skin when the tissues begin to warm after being iced. °¨ Cold erythema. This is a red, itchy rash caused by exposure to cold. °¨ Cold hemoglobinuria. Red blood cells break down when the tissues begin to warm after being iced. The hemoglobin that carry oxygen are passed into the urine because they cannot combine with blood proteins fast enough. °· Numbness or altered sensitivity in the area being iced. °If you have any of the following conditions, do not use ice until you have discussed cryotherapy with your caregiver: °· Heart conditions, such as arrhythmia, angina, or chronic heart disease. °· High blood pressure. °· Healing wounds or open skin in the area being iced. °· Current infections. °· Rheumatoid arthritis. °· Poor circulation. °· Diabetes. °Ice slows the blood flow in the region it is applied. This is beneficial when trying to stop inflamed tissues from spreading irritating chemicals to surrounding tissues. However, if you expose your skin to cold temperatures for too long or without the proper protection, you can damage your skin or nerves. Watch for signs of skin damage due to cold. °HOME CARE INSTRUCTIONS °Follow  these tips to use ice and cold packs safely. °· Place a dry or damp towel between the ice and skin. A damp towel will cool the skin more quickly, so you may need to shorten the time that the ice is used. °· For a more rapid response, add gentle compression to the ice. °· Ice for no more than 10 to 20 minutes at a time. The bonier the area you are icing, the less time it will take to get the benefits of ice. °· Check your skin after 5 minutes to make sure there are no signs of a poor response to cold or skin damage. °· Rest 20 minutes or more between uses. °· Once your skin is numb, you can end your treatment. You can test numbness by very lightly touching your skin. The touch should be so light that you do not see the skin dimple from the pressure of your fingertip. When using ice, most people will feel these normal sensations in this order: cold, burning, aching, and numbness. °· Do not use ice on someone who cannot communicate their responses to pain, such as small children or people with dementia. °HOW TO MAKE AN ICE PACK °Ice packs are the most common way to use ice therapy. Other methods include ice massage, ice baths, and cryosprays. Muscle creams that cause a cold, tingly feeling do not offer the same benefits that ice offers and should not be used as a substitute unless recommended by your caregiver. °To make an ice pack, do one of the following: °· Place crushed ice or a   bag of frozen vegetables in a sealable plastic bag. Squeeze out the excess air. Place this bag inside another plastic bag. Slide the bag into a pillowcase or place a damp towel between your skin and the bag. °· Mix 3 parts water with 1 part rubbing alcohol. Freeze the mixture in a sealable plastic bag. When you remove the mixture from the freezer, it will be slushy. Squeeze out the excess air. Place this bag inside another plastic bag. Slide the bag into a pillowcase or place a damp towel between your skin and the bag. °SEEK MEDICAL CARE  IF: °· You develop white spots on your skin. This may give the skin a blotchy (mottled) appearance. °· Your skin turns blue or pale. °· Your skin becomes waxy or hard. °· Your swelling gets worse. °MAKE SURE YOU:  °· Understand these instructions. °· Will watch your condition. °· Will get help right away if you are not doing well or get worse. °Document Released: 08/12/2011 Document Revised: 05/02/2014 Document Reviewed: 08/12/2011 °ExitCare® Patient Information ©2015 [employer]. This information is not intended to replace advice given to you by your health care provider. Make sure you discuss any questions you have with your health care provider. ° °Contusion °A contusion is a deep bruise. Contusions are the result of an injury that caused bleeding under the skin. The contusion may turn blue, purple, or yellow. Minor injuries will give you a painless contusion, but more severe contusions may stay painful and swollen for a few weeks.  °CAUSES  °A contusion is usually caused by a blow, trauma, or direct force to an area of the body. °SYMPTOMS  °· Swelling and redness of the injured area. °· Bruising of the injured area. °· Tenderness and soreness of the injured area. °· Pain. °DIAGNOSIS  °The diagnosis can be made by taking a history and physical exam. An X-ray, CT scan, or MRI may be needed to determine if there were any associated injuries, such as fractures. °TREATMENT  °Specific treatment will depend on what area of the body was injured. In general, the best treatment for a contusion is resting, icing, elevating, and applying cold compresses to the injured area. Over-the-counter medicines may also be recommended for pain control. Ask your caregiver what the best treatment is for your contusion. °HOME CARE INSTRUCTIONS  °· Put ice on the injured area. °¨ Put ice in a plastic bag. °¨ Place a towel between your skin and the bag. °¨ Leave the ice on for 15-20 minutes, 3-4 times a day, or as directed by your health  care provider. °· Only take over-the-counter or prescription medicines for pain, discomfort, or fever as directed by your caregiver. Your caregiver may recommend avoiding anti-inflammatory medicines (aspirin, ibuprofen, and naproxen) for 48 hours because these medicines may increase bruising. °· Rest the injured area. °· If possible, elevate the injured area to reduce swelling. °SEEK IMMEDIATE MEDICAL CARE IF:  °· You have increased bruising or swelling. °· You have pain that is getting worse. °· Your swelling or pain is not relieved with medicines. °MAKE SURE YOU:  °· Understand these instructions. °· Will watch your condition. °· Will get help right away if you are not doing well or get worse. °Document Released: 09/25/2005 Document Revised: 12/21/2013 Document Reviewed: 10/21/2011 °ExitCare® Patient Information ©2015 [employer]. This information is not intended to replace advice given to you by your health care provider. Make sure you discuss any questions you have with your health care provider. ° °  Subungual Hematoma A subungual hematoma is a pocket of blood that collects under the fingernail or toenail. The pressure created by the blood under the nail can cause pain. CAUSES  A subungual hematoma occurs when an injury to the finger or toe causes a blood vessel beneath the nail to break. The injury can occur from a direct blow such as slamming a finger in a door. It can also occur from a repeated injury such as pressure on the foot in a shoe while running. A subungual hematoma is sometimes called runner's toe or tennis toe. SYMPTOMS   Blue or dark blue skin under the nail.  Pain or throbbing in the injured area. DIAGNOSIS  Your caregiver can determine whether you have a subungual hematoma based on your history and a physical exam. If your caregiver thinks you might have a broken (fractured) bone, X-rays may be taken. TREATMENT  Hematomas usually go away on their own over time.  In some cases, the  nail may need to be removed. This is done if there is a cut under the nail that requires stitches (sutures). HOME CARE INSTRUCTIONS   Put ice on the injured area.  Put ice in a plastic bag.  Place a towel between your skin and the bag.  Leave the ice on for 15-20 minutes, 03-04 times a day for the first 1 to 2 days.  Elevate the injured area to help decrease pain and swelling.  If you were given a bandage, wear it for as long as directed by your caregiver.  If part of your nail falls off, trim the remaining nail gently. This prevents the nail from catching on something and causing further injury.  Only take over-the-counter or prescription medicines for pain, discomfort, or fever as directed by your caregiver. SEEK IMMEDIATE MEDICAL CARE IF:   You have redness or swelling around the nail.  You have yellowish-white fluid (pus) coming from the nail.  Your pain is not controlled with medicine.  You have a fever. MAKE SURE YOU:  Understand these instructions.  Will watch your condition.  Will get help right away if you are not doing well or get worse. Document Released: 12/13/2000 Document Revised: 03/09/2012 Document Reviewed: 12/04/2011 [facility] Patient Information 2015 [place], [place]. This information is not intended to replace advice given to you by your health care provider. Make sure you discuss any questions you have with your health care provider.

## 2014-09-02 NOTE — ED Notes (Signed)
Pt in c/o injury to his left hand, states it was slammed in a door, swelling noted to thumb and pointer finger

## 2014-09-02 NOTE — ED Notes (Signed)
Pt alert and oriented at discharge.  Pt verbalized understanding of discharge instructions.  Pt stated he would not be driving tonight but going to stay with his cousin who is in the [employer].  Pt advised of the hazards of driving until he knows how this medication with affect him.

## 2014-09-02 NOTE — ED Notes (Signed)
Dr Otter at bedside  

## 2014-09-02 NOTE — ED Provider Notes (Signed)
CSN: [id number]     Arrival date & time 09/02/14  0048 History   First MD Initiated Contact with Patient 09/02/14 0309     Chief Complaint  Patient presents with  . Hand Injury     (Consider location/radiation/quality/duration/timing/severity/associated sxs/prior Treatment) HPI 18 yo male presents to the ER after slamming his left thumb and index finger in the door.  Pt c/o pain, difficulty with movement due to pain.   History reviewed. No pertinent past medical history. Past Surgical History  Procedure Laterality Date  . Tonsillectomy    . Adenoidectomy     History reviewed. No pertinent family history. History  Substance Use Topics  . Smoking status: Never Smoker   . Smokeless tobacco: Not on file  . Alcohol Use: No    Review of Systems  Constitutional: Negative.   HENT: Negative.   Eyes: Negative.   Respiratory: Negative.   Cardiovascular: Negative.   Gastrointestinal: Negative.   Genitourinary: Negative.   Musculoskeletal: Negative.   Skin: Negative.   Neurological: Negative.   Psychiatric/Behavioral: Negative.   All other systems reviewed and are negative.     Allergies  Review of patient's allergies indicates no known allergies.  Home Medications   Prior to Admission medications   Medication Sig Start Date End Date Taking? Authorizing Provider  HYDROcodone-acetaminophen (NORCO/VICODIN) 5-325 MG per tablet Take 2 tablets by mouth every 4 (four) hours as needed for moderate pain or severe pain. 09/02/14   Olivia Mackie, MD   BP 130/82  Pulse 56  Temp(Src) 98 F (36.7 C) (Oral)  Resp 17  Wt 218 lb 3 oz (98.969 kg)  SpO2 100% Physical Exam  Nursing note and vitals reviewed. Constitutional: He is oriented to person, place, and time. He appears well-developed and well-nourished. He appears distressed.  HENT:  Head: Normocephalic and atraumatic.  Eyes: Conjunctivae and EOM are normal. Pupils are equal, round, and reactive to light.  Neck: Normal range of  motion. Neck supple. No JVD present. No tracheal deviation present. No thyromegaly present.  Cardiovascular: Normal rate, regular rhythm, normal heart sounds and intact distal pulses.  Exam reveals no gallop and no friction rub.   No murmur heard. Pulmonary/Chest: No stridor.  Musculoskeletal: He exhibits edema and tenderness.  subungal hematoma to left thumb Soft tissue swelling to thumb, index finger  Decreased ROM secondary to pain  Lymphadenopathy:    He has no cervical adenopathy.  Neurological: He is alert and oriented to person, place, and time. He exhibits normal muscle tone. Coordination normal.  Skin: Skin is warm and dry. No rash noted. No erythema. No pallor.  Psychiatric: He has a normal mood and affect. His behavior is normal. Judgment and thought content normal.    ED Course  Procedures (including critical care time) Labs Review Labs Reviewed - No data to display   Imaging Review Dg Hand Complete Left  09/02/2014   CLINICAL DATA:  HAND INJURY  EXAM: LEFT HAND - COMPLETE 3+ VIEW  COMPARISON:  None.  FINDINGS: There is no evidence of fracture or dislocation. There is no evidence of arthropathy or other focal bone abnormality. Soft tissues are unremarkable.  IMPRESSION: Negative.   Electronically Signed   By: Oley Balm M.D.   On: 09/02/2014 01:56     EKG Interpretation None      MDM   Final diagnoses:  Contusion of thumb (nail), left, initial encounter  Contusion of finger of left hand, initial encounter  Contusion of thumb, left, initial  encounter    Contusion, RICE      Olivia Mackie, MD 09/06/14 [phone]

## 2015-03-19 ENCOUNTER — Encounter (HOSPITAL_COMMUNITY): Payer: Self-pay | Admitting: Emergency Medicine

## 2015-03-19 ENCOUNTER — Emergency Department (HOSPITAL_COMMUNITY)
Admission: EM | Admit: 2015-03-19 | Discharge: 2015-03-19 | Disposition: A | Discharge status: Home / Self Care | Payer: Self-pay | Attending: Emergency Medicine | Admitting: Emergency Medicine

## 2015-03-19 DIAGNOSIS — K529 Noninfective gastroenteritis and colitis, unspecified: Secondary | ICD-10-CM | POA: Insufficient documentation

## 2015-03-19 LAB — CBC WITH DIFFERENTIAL/PLATELET
BASOS ABS: 0 10*3/uL (ref 0.0–0.1)
Basophils Relative: 0 % (ref 0–1)
Eosinophils Absolute: 0.1 10*3/uL (ref 0.0–0.7)
Eosinophils Relative: 1 % (ref 0–5)
HCT: 45.6 % (ref 39.0–52.0)
Hemoglobin: 15.8 g/dL (ref 13.0–17.0)
LYMPHS ABS: 2 10*3/uL (ref 0.7–4.0)
LYMPHS PCT: 28 % (ref 12–46)
MCH: 29.3 pg (ref 26.0–34.0)
MCHC: 34.6 g/dL (ref 30.0–36.0)
MCV: 84.4 fL (ref 78.0–100.0)
MONO ABS: 0.5 10*3/uL (ref 0.1–1.0)
Monocytes Relative: 7 % (ref 3–12)
NEUTROS PCT: 64 % (ref 43–77)
Neutro Abs: 4.8 10*3/uL (ref 1.7–7.7)
Platelets: 255 10*3/uL (ref 150–400)
RBC: 5.4 MIL/uL (ref 4.22–5.81)
RDW: 13.6 % (ref 11.5–15.5)
WBC: 7.4 10*3/uL (ref 4.0–10.5)

## 2015-03-19 LAB — COMPREHENSIVE METABOLIC PANEL
ALK PHOS: 54 U/L (ref 39–117)
ALT: 17 U/L (ref 0–53)
ANION GAP: 7 (ref 5–15)
AST: 49 U/L — ABNORMAL HIGH (ref 0–37)
Albumin: 4.7 g/dL (ref 3.5–5.2)
BILIRUBIN TOTAL: 1.2 mg/dL (ref 0.3–1.2)
BUN: 8 mg/dL (ref 6–23)
CO2: 29 mmol/L (ref 19–32)
Calcium: 9.8 mg/dL (ref 8.4–10.5)
Chloride: 103 mmol/L (ref 96–112)
Creatinine, Ser: 1.14 mg/dL (ref 0.50–1.35)
GFR calc non Af Amer: >90 mL/min (ref >90)
Glucose, Bld: 124 mg/dL — ABNORMAL HIGH (ref 70–99)
Potassium: 4.8 mmol/L (ref 3.5–5.1)
Sodium: 139 mmol/L (ref 135–145)
TOTAL PROTEIN: 8.1 g/dL (ref 6.0–8.3)

## 2015-03-19 LAB — LIPASE, BLOOD: Lipase: 24 U/L (ref 11–59)

## 2015-03-19 MED ORDER — KETOROLAC TROMETHAMINE 30 MG/ML IJ SOLN
30.0000 mg | Freq: Once | INTRAMUSCULAR | Status: AC
  Administered 2015-03-19: 30 mg via INTRAVENOUS
  Filled 2015-03-19: qty 1

## 2015-03-19 MED ORDER — ONDANSETRON HCL 4 MG/2ML IJ SOLN
4.0000 mg | Freq: Once | INTRAMUSCULAR | Status: AC
  Administered 2015-03-19: 4 mg via INTRAVENOUS

## 2015-03-19 MED ORDER — ONDANSETRON 8 MG PO TBDP: ORAL_TABLET | ORAL | Status: DC

## 2015-03-19 MED ORDER — ONDANSETRON HCL 4 MG/2ML IJ SOLN
INTRAMUSCULAR | Status: AC
  Filled 2015-03-19: qty 2

## 2015-03-19 NOTE — Discharge Instructions (Signed)
Zofran as needed for nausea.  Clear liquid diet for the next 12 hours, then slowly advance to normal.  Return to the emergency department for increasing abdominal pain, bloody stool, or other new and concerning symptoms.   Viral Gastroenteritis Viral gastroenteritis is also known as stomach flu. This condition affects the stomach and intestinal tract. It can cause sudden diarrhea and vomiting. The illness typically lasts 3 to 8 days. Most people develop an immune response that eventually gets rid of the virus. While this natural response develops, the virus can make you quite ill. CAUSES  Many different viruses can cause gastroenteritis, such as rotavirus or noroviruses. You can catch one of these viruses by consuming contaminated food or water. You may also catch a virus by sharing utensils or other personal items with an infected person or by touching a contaminated surface. SYMPTOMS  The most common symptoms are diarrhea and vomiting. These problems can cause a severe loss of body fluids (dehydration) and a body salt (electrolyte) imbalance. Other symptoms may include:  Fever.  Headache.  Fatigue.  Abdominal pain. DIAGNOSIS  Your caregiver can usually diagnose viral gastroenteritis based on your symptoms and a physical exam. A stool sample may also be taken to test for the presence of viruses or other infections. TREATMENT  This illness typically goes away on its own. Treatments are aimed at rehydration. The most serious cases of viral gastroenteritis involve vomiting so severely that you are not able to keep fluids down. In these cases, fluids must be given through an intravenous line (IV). HOME CARE INSTRUCTIONS   Drink enough fluids to keep your urine clear or pale yellow. Drink small amounts of fluids frequently and increase the amounts as tolerated.  Ask your caregiver for specific rehydration instructions.  Avoid:  Foods high in sugar.  Alcohol.  Carbonated  drinks.  Tobacco.  Juice.  Caffeine drinks.  Extremely hot or cold fluids.  Fatty, greasy foods.  Too much intake of anything at one time.  Dairy products until 24 to 48 hours after diarrhea stops.  You may consume probiotics. Probiotics are active cultures of beneficial bacteria. They may lessen the amount and number of diarrheal stools in adults. Probiotics can be found in yogurt with active cultures and in supplements.  Wash your hands well to avoid spreading the virus.  Only take over-the-counter or prescription medicines for pain, discomfort, or fever as directed by your caregiver. Do not give aspirin to children. Antidiarrheal medicines are not recommended.  Ask your caregiver if you should continue to take your regular prescribed and over-the-counter medicines.  Keep all follow-up appointments as directed by your caregiver. SEEK IMMEDIATE MEDICAL CARE IF:   You are unable to keep fluids down.  You do not urinate at least once every 6 to 8 hours.  You develop shortness of breath.  You notice blood in your stool or vomit. This may look like coffee grounds.  You have abdominal pain that increases or is concentrated in one small area (localized).  You have persistent vomiting or diarrhea.  You have a fever.  The patient is a child younger than 3 months, and he or she has a fever.  The patient is a child older than 3 months, and he or she has a fever and persistent symptoms.  The patient is a child older than 3 months, and he or she has a fever and symptoms suddenly get worse.  The patient is a baby, and he or she has no tears  when crying. MAKE SURE YOU:   Understand these instructions.  Will watch your condition.  Will get help right away if you are not doing well or get worse. Document Released: 12/16/2005 Document Revised: 03/09/2012 Document Reviewed: 10/02/2011 [facility] Patient Information 2015 [place], [place]. This information is not intended to replace  advice given to you by your health care provider. Make sure you discuss any questions you have with your health care provider.

## 2015-03-19 NOTE — ED Provider Notes (Signed)
CSN: [id number]     Arrival date & time 03/19/15  0408 History   First MD Initiated Contact with Patient 03/19/15 [phone]     Chief Complaint  Patient presents with  . Emesis     (Consider location/radiation/quality/duration/timing/severity/associated sxs/prior Treatment) HPI Comments: Patient is 19 year old male otherwise healthy who presents for evaluation of vomiting. He stated he felt nauseated yesterday evening and began vomiting about 9:00. This has continued since that time. He denies any diarrhea. He denies any significant abdominal pain but does report some cramping when he vomits. He has no prior history of abdominal surgery. States that he did eat today at [employer], but no acquaintances are ill in a similar fashion.  Patient is a 19 y.o. male presenting with vomiting. The history is provided by the patient.  Emesis Severity:  Moderate Duration:  8 hours Timing:  Constant Quality:  Bilious material and stomach contents Progression:  Worsening Chronicity:  New Recent urination:  Normal Relieved by:  Nothing Worsened by:  Nothing tried Ineffective treatments:  None tried Associated symptoms: no abdominal pain, no diarrhea and no fever     History reviewed. No pertinent past medical history. Past Surgical History  Procedure Laterality Date  . Tonsillectomy    . Adenoidectomy     No family history on file. History  Substance Use Topics  . Smoking status: Never Smoker   . Smokeless tobacco: Not on file  . Alcohol Use: No    Review of Systems  Gastrointestinal: Positive for vomiting. Negative for abdominal pain and diarrhea.  All other systems reviewed and are negative.     Allergies  Review of patient's allergies indicates no known allergies.  Home Medications   Prior to Admission medications   Medication Sig Start Date End Date Taking? Authorizing Provider  HYDROcodone-acetaminophen (NORCO/VICODIN) 5-325 MG per tablet Take 2 tablets by mouth every 4 (four)  hours as needed for moderate pain or severe pain. 09/02/14   Marisa Severinlga Otter, MD   BP 132/90 mmHg  Pulse 66  Temp(Src) 97.9 F (36.6 C) (Oral)  Resp 20  Ht 6' (1.829 m)  Wt 212 lb (96.163 kg)  BMI 28.75 kg/m2  SpO2 97% Physical Exam  Constitutional: He is oriented to person, place, and time. He appears well-developed and well-nourished. No distress.  HENT:  Head: Normocephalic and atraumatic.  Mouth/Throat: Oropharynx is clear and moist.  Neck: Normal range of motion. Neck supple.  Cardiovascular: Normal rate, regular rhythm and normal heart sounds.   No murmur heard. Pulmonary/Chest: Effort normal and breath sounds normal. No respiratory distress. He has no wheezes.  Abdominal: Soft. Bowel sounds are normal. He exhibits no distension. There is no tenderness.  Musculoskeletal: Normal range of motion. He exhibits no edema.  Lymphadenopathy:    He has no cervical adenopathy.  Neurological: He is alert and oriented to person, place, and time.  Skin: Skin is warm and dry. He is not diaphoretic.  Nursing note and vitals reviewed.   ED Course  Procedures (including critical care time) Labs Review Labs Reviewed  CBC WITH DIFFERENTIAL/PLATELET  COMPREHENSIVE METABOLIC PANEL  LIPASE, BLOOD    Imaging Review No results found.   EKG Interpretation None      MDM   Final diagnoses:  None    Patient's presentation, exam, and workup are consistent with a viral gastroenteritis. He is feeling better with medications given in the ER and fluids. He will be discharged to home with Zofran and when necessary return.  Geoffery Lyons, MD 03/19/15 [phone]

## 2015-03-19 NOTE — ED Notes (Signed)
Pt arrives with sudden onset emesis and nausea ongoing since last night at 2100, states intense nausea. Hiccups.

## 2016-02-28 ENCOUNTER — Emergency Department (INDEPENDENT_AMBULATORY_CARE_PROVIDER_SITE_OTHER): Payer: Self-pay

## 2016-02-28 ENCOUNTER — Emergency Department (INDEPENDENT_AMBULATORY_CARE_PROVIDER_SITE_OTHER)
Admission: EM | Admit: 2016-02-28 | Discharge: 2016-02-28 | Disposition: A | Discharge status: Home / Self Care | Payer: Self-pay | Source: Home / Self Care | Attending: Family Medicine | Admitting: Family Medicine

## 2016-02-28 ENCOUNTER — Encounter (HOSPITAL_COMMUNITY): Payer: Self-pay | Admitting: Emergency Medicine

## 2016-02-28 DIAGNOSIS — J111 Influenza due to unidentified influenza virus with other respiratory manifestations: Secondary | ICD-10-CM

## 2016-02-28 NOTE — ED Provider Notes (Signed)
CSN: [id number]     Arrival date & time 02/28/16  1622 History   First MD Initiated Contact with Patient 02/28/16 1733     Chief Complaint  Patient presents with  . URI   (Consider location/radiation/quality/duration/timing/severity/associated sxs/prior Treatment) HPI Pt presents with sore throat, body aches, fever, chills for 1 days Home treatment has been OTC meds without much relief of symptoms Fever is improved for short periods of time with OTC antipyretics. Pain score is 4 mostly from coughing and body aches Taking fluids, no appetite No flu shot Has been exposed to others with similar symptoms.  Denies: CP, SOB, vomiting or diarrhea.  History reviewed. No pertinent past medical history. Past Surgical History  Procedure Laterality Date  . Tonsillectomy    . Adenoidectomy     No family history on file. Social History  Substance Use Topics  . Smoking status: Never Smoker   . Smokeless tobacco: None  . Alcohol Use: No    Review of Systems See hpi Allergies  Review of patient's allergies indicates no known allergies.  Home Medications   Prior to Admission medications   Medication Sig Start Date End Date Taking? Authorizing Provider  Chlorphen-Pseudoephed-APAP [facility] FLU/COLD PO) Take by mouth.   Yes Historical Provider, MD  HYDROcodone-acetaminophen (NORCO/VICODIN) 5-325 MG per tablet Take 2 tablets by mouth every 4 (four) hours as needed for moderate pain or severe pain. Patient not taking: Reported on 03/19/2015 09/02/14   Marisa Severin, MD  ondansetron (ZOFRAN ODT) 8 MG disintegrating tablet  ODT q4 hours prn nausea 03/19/15   Geoffery Lyons, MD   Meds Ordered and Administered this Visit  Medications - No data to display  BP 148/88 mmHg  Pulse 78  Temp(Src) 101 F (38.3 C) (Oral)  Resp 20  SpO2 98% No data found.   Physical Exam NURSES NOTES AND VITAL SIGNS REVIEWED. CONSTITUTIONAL: Well developed, well nourished, no acute distress HEENT: normocephalic,  atraumatic, right and left TM's are normal, slight left sided yellow PND noted.  EYES: Conjunctiva normal NECK:normal ROM, supple, no adenopathy PULMONARY:No respiratory distress, normal effort, Lungs: CTAb/l, no wheezes, or increased work of breathing CARDIOVASCULAR: RRR, no murmur ABDOMEN: soft, ND, NT, +'ve BS MUSCULOSKELETAL: Normal ROM of all extremities,  SKIN: warm and dry without rash PSYCHIATRIC: Mood and affect, behavior are normal  ED Course  Procedures (including critical care time)  Labs Review Labs Reviewed - No data to display  Imaging Review Dg Chest 2 View  02/28/2016  CLINICAL DATA:  Cough for two weeks. Fever today. Initial encounter. EXAM: CHEST  2 VIEW COMPARISON:  PA and lateral chest 07/20/2014. FINDINGS: The lungs are clear. Heart size is normal. No pneumothorax or pleural effusion. No focal abnormality. IMPRESSION: Negative chest. Electronically Signed   By: Drusilla Kanner M.D.   On: 02/28/2016 18:00     Visual Acuity Review  Right Eye Distance:   Left Eye Distance:   Bilateral Distance:    Right Eye Near:   Left Eye Near:    Bilateral Near:         MDM   1. Flu    Patient is reassured that there are no issues that require transfer to higher level of care at this time.  Patient is advised to continue home symptomatic treatment. tamiflu Prescription is sent to  pharmacy patient has indicated.  Patient is advised that if there are new or worsening symptoms or attend the emergency department, or contact primary care provider. Instructions of care provided  discharged home in stable condition. Return to work/school note provided.  THIS NOTE WAS GENERATED USING A VOICE RECOGNITION SOFTWARE PROGRAM. ALL REASONABLE EFFORTS  WERE MADE TO PROOFREAD THIS DOCUMENT FOR ACCURACY.     Tharon Aquas, PA 02/28/16 2017

## 2016-02-28 NOTE — ED Notes (Signed)
Virus/cold

## 2016-02-28 NOTE — Discharge Instructions (Signed)
Influenza, Adult Influenza (flu) is an infection in the mouth, nose, and throat (respiratory tract) caused by a virus. The flu can make you feel very ill. Influenza spreads easily from person to person (contagious).  HOME CARE   Only take medicines as told by your doctor.  Use a cool mist humidifier to make breathing easier.  Get plenty of rest until your fever goes away. This usually takes 3 to 4 days.  Drink enough fluids to keep your pee (urine) clear or pale yellow.  Cover your mouth and nose when you cough or sneeze.  Wash your hands well to avoid spreading the flu.  Stay home from work or school until your fever has been gone for at least 1 full day.  Get a flu shot every year. GET HELP RIGHT AWAY IF:   You have trouble breathing or feel short of breath.  Your skin or nails turn blue.  You have severe neck pain or stiffness.  You have a severe headache, facial pain, or earache.  Your fever gets worse or keeps coming back.  You feel sick to your stomach (nauseous), throw up (vomit), or have watery poop (diarrhea).  You have chest pain.  You have a deep cough that gets worse, or you cough up more thick spit (mucus). MAKE SURE YOU:   Understand these instructions.  Will watch your condition.  Will get help right away if you are not doing well or get worse.   This information is not intended to replace advice given to you by your health care provider. Make sure you discuss any questions you have with your health care provider.   Document Released: 09/24/2008 Document Revised: 01/06/2015 Document Reviewed: 03/16/2012 Elsevier Interactive Patient Education 2016 Elsevier Inc.  

## 2016-02-28 NOTE — ED Notes (Signed)
Patient sick for 2 weeks.  Patient reports sore throat, headache, chest and stomach soreness with cough, general aches and pain.

## 2017-01-11 ENCOUNTER — Encounter (HOSPITAL_COMMUNITY): Payer: Self-pay | Admitting: Family Medicine

## 2017-01-11 ENCOUNTER — Ambulatory Visit (HOSPITAL_COMMUNITY)
Admission: EM | Admit: 2017-01-11 | Discharge: 2017-01-11 | Disposition: A | Discharge status: Home / Self Care | Payer: Self-pay | Attending: Internal Medicine | Admitting: Internal Medicine

## 2017-01-11 DIAGNOSIS — K529 Noninfective gastroenteritis and colitis, unspecified: Secondary | ICD-10-CM

## 2017-01-11 MED ORDER — ONDANSETRON 4 MG PO TBDP
ORAL_TABLET | ORAL | Status: AC
  Filled 2017-01-11: qty 2

## 2017-01-11 MED ORDER — ONDANSETRON 8 MG PO TBDP: 8.0000 mg | ORAL_TABLET | Freq: Three times a day (TID) | ORAL | 0 refills | Status: AC | PRN

## 2017-01-11 MED ORDER — ONDANSETRON 4 MG PO TBDP
8.0000 mg | ORAL_TABLET | Freq: Once | ORAL | Status: AC
  Administered 2017-01-11: 8 mg via ORAL

## 2017-01-11 NOTE — ED Triage Notes (Signed)
Pt here for N,V,D that started Tuesday. sts some lower abd pain that is intermittent.

## 2017-01-11 NOTE — Discharge Instructions (Signed)
Take Zofran as needed for nausea.  It seems that your diarrhea is gradually improving; hopefully it will continue to improve, if your diarrhea does not improve by Monday, then you need to get in with the primary care doctor to be reevaluated

## 2017-01-11 NOTE — ED Provider Notes (Signed)
CSN: [id number]     Arrival date & time 01/11/17  1214 History   First MD Initiated Contact with Patient 01/11/17 1505     Chief Complaint  Patient presents with  . Diarrhea  . Emesis   (Consider location/radiation/quality/duration/timing/severity/associated sxs/prior Treatment) Patient is a healthy 21 year old male, with no medical history, presents today for headache, nausea, lightheadedness, and diarrhea since Tuesday with a new onset of vomiting onset 2 days ago.  Patient reports the diarrhea to be watery. Patient had 4 episodes of diarrhea yesterday and 3 episodes today. Denies blood in stool. Patient feels that his diarrhea is gradually getting better. Patient had 2 episodes of emesis yesterday, and 2 episodes today.  Patient denies blood in his emesis. Emesis also nonbilious and nonprojectile. Patient is feeling nauseous with generalized stomach discomfort. He denies fever at home. Denies sick contact exposure. Denies dining out prior to symptom onset.          History reviewed. No pertinent past medical history. Past Surgical History:  Procedure Laterality Date  . ADENOIDECTOMY    . TONSILLECTOMY     History reviewed. No pertinent family history. Social History  Substance Use Topics  . Smoking status: Never Smoker  . Smokeless tobacco: Never Used  . Alcohol use No    Review of Systems  Constitutional: Positive for appetite change. Negative for chills, fatigue and fever.  HENT: Positive for congestion, rhinorrhea and sneezing. Negative for sore throat.        Reports to have chronic congestion since childhood   Eyes: Negative for visual disturbance.  Respiratory: Positive for cough. Negative for shortness of breath and wheezing.   Cardiovascular: Negative for chest pain and palpitations.  Gastrointestinal: Positive for abdominal pain, blood in stool, diarrhea, nausea and vomiting. Negative for abdominal distention and constipation.  Genitourinary: Negative for dysuria  and frequency.  Skin: Negative for rash.  Neurological: Positive for light-headedness and headaches. Negative for dizziness and syncope.    Allergies  Patient has no known allergies.  Home Medications   Prior to Admission medications   Medication Sig Start Date End Date Taking? Authorizing Provider  Chlorphen-Pseudoephed-APAP [facility] FLU/COLD PO) Take by mouth.    Historical Provider, MD  HYDROcodone-acetaminophen (NORCO/VICODIN) 5-325 MG per tablet Take 2 tablets by mouth every 4 (four) hours as needed for moderate pain or severe pain. Patient not taking: Reported on 03/19/2015 09/02/14   Marisa Severin, MD  ondansetron (ZOFRAN-ODT) 8 MG disintegrating tablet Take 1 tablet (8 mg total) by mouth every 8 (eight) hours as needed for nausea or vomiting. 01/11/17 01/14/17  Lucia Estelle, NP   Meds Ordered and Administered this Visit   Medications  ondansetron (ZOFRAN-ODT) disintegrating tablet 8 mg (8 mg Oral Given 01/11/17 1520)    BP 137/76   Pulse 67   Temp 98.1 F (36.7 C)   Resp 18   SpO2 100%  No data found.   Physical Exam  Constitutional: He is oriented to person, place, and time. He appears well-developed and well-nourished.  HENT:  Head: Normocephalic and atraumatic.  Right Ear: External ear normal.  Left Ear: External ear normal.  Nose: Nose normal.  Mouth/Throat: Oropharynx is clear and moist. No oropharyngeal exudate.  TM pearly gray bilaterally with no erythema.  Eyes: Conjunctivae are normal. Pupils are equal, round, and reactive to light.  Neck: Normal range of motion. Neck supple.  No lymphadenopathy.  Cardiovascular: Normal rate, regular rhythm and normal heart sounds.   No murmur heard. Pulmonary/Chest: Effort normal  and breath sounds normal. No respiratory distress.  Abdominal: Soft. Bowel sounds are normal.  No tenderness on palpation.  Neurological: He is alert and oriented to person, place, and time.  Skin: Skin is warm and dry. No rash noted.  Nursing note  and vitals reviewed.   Urgent Care Course   Clinical Course     Procedures (including critical care time)  Labs Review Labs Reviewed - No data to display  Imaging Review No results found.  MDM   1. Gastroenteritis    Zofran was administered and the urgent care; patient felt better after Zofran.  Physical examination unremarkable. Patient appears well. Vital signs normal. Viral gastroenteritis is suspected. Educated on rest and hydration. Patient reports that his diarrhea is gradually improving, believe that his diarrhea will continue to improve. Informed to see a family care provider on Monday or Tuesday if his diarrhea persists; stool culture may be needed then. Rx for Zofran given.   Work excuse given for tomorrow. May return to work on Monday if diarrhea resolved by then. Do not return to work if diarrhea is still present.        Lucia EstelleFeng Stacie Templin, NP 01/11/17 [phone]

## 2017-07-08 ENCOUNTER — Ambulatory Visit (HOSPITAL_COMMUNITY)
Admission: EM | Admit: 2017-07-08 | Discharge: 2017-07-08 | Disposition: A | Discharge status: Home / Self Care | Payer: Medicaid Other | Attending: Family Medicine | Admitting: Family Medicine

## 2017-07-08 ENCOUNTER — Encounter (HOSPITAL_COMMUNITY): Payer: Self-pay | Admitting: Emergency Medicine

## 2017-07-08 DIAGNOSIS — R51 Headache: Secondary | ICD-10-CM

## 2017-07-08 DIAGNOSIS — J208 Acute bronchitis due to other specified organisms: Secondary | ICD-10-CM

## 2017-07-08 DIAGNOSIS — R059 Cough, unspecified: Secondary | ICD-10-CM

## 2017-07-08 DIAGNOSIS — R05 Cough: Secondary | ICD-10-CM

## 2017-07-08 MED ORDER — AZITHROMYCIN 250 MG PO TABS: 250.0000 mg | ORAL_TABLET | Freq: Every day | ORAL | 0 refills | Status: DC

## 2017-07-08 MED ORDER — BENZONATATE 100 MG PO CAPS: 200.0000 mg | ORAL_CAPSULE | Freq: Three times a day (TID) | ORAL | 0 refills | Status: DC | PRN

## 2017-07-08 MED ORDER — IPRATROPIUM BROMIDE 0.06 % NA SOLN: 2.0000 | Freq: Four times a day (QID) | NASAL | 0 refills | Status: DC

## 2017-07-08 NOTE — ED Triage Notes (Signed)
Patient complains of chest and head pain for 2 days, gradually worsening.  Patient  Says a sharp pain in chest for 5-10 minutes.  Patient touches left chest as location of pain.  Denies nausea, no vomiting, no sob.  Patient reports headache.  Patient denies movement alterring pain in chest.

## 2017-07-09 NOTE — ED Provider Notes (Signed)
CSN: [id number]     Arrival date & time 07/08/17  1900 History   None    Chief Complaint  Patient presents with  . Headache  . Chest Pain   (Consider location/radiation/quality/duration/timing/severity/associated sxs/prior Treatment) Patient c/o chest wall pain when he moves and headache.  He c/o cough and uri sx's   The history is provided by the patient.  Headache  Pain location:  Generalized Severity currently:  5/10 Severity at highest:  5/10 Timing:  Constant Chronicity:  New Context: coughing   Relieved by:  Nothing Associated symptoms: cough   Chest Pain  Associated symptoms: cough and headache     History reviewed. No pertinent past medical history. Past Surgical History:  Procedure Laterality Date  . ADENOIDECTOMY    . TONSILLECTOMY     No family history on file. Social History  Substance Use Topics  . Smoking status: Never Smoker  . Smokeless tobacco: Never Used  . Alcohol use No    Review of Systems  Constitutional: Negative.   HENT: Positive for rhinorrhea.   Eyes: Negative.   Respiratory: Positive for cough.   Cardiovascular: Positive for chest pain.  Gastrointestinal: Negative.   Endocrine: Negative.   Genitourinary: Negative.   Musculoskeletal: Positive for arthralgias.  Allergic/Immunologic: Negative.   Neurological: Positive for headaches.  Hematological: Negative.   Psychiatric/Behavioral: Negative.     Allergies  Patient has no known allergies.  Home Medications   Prior to Admission medications   Medication Sig Start Date End Date Taking? Authorizing Provider  azithromycin (ZITHROMAX) 250 MG tablet Take 1 tablet (250 mg total) by mouth daily. Take first 2 tablets together, then 1 every day until finished. 07/08/17   Deatra Canterxford, William J, FNP  benzonatate (TESSALON) 100 MG capsule Take 2 capsules (200 mg total) by mouth 3 (three) times daily as needed for cough. 07/08/17   Deatra Canterxford, William J, FNP  Chlorphen-Pseudoephed-APAP (THERAFLU  FLU/COLD PO) Take by mouth.    [provider]  HYDROcodone-acetaminophen (NORCO/VICODIN) 5-325 MG per tablet Take 2 tablets by mouth every 4 (four) hours as needed for moderate pain or severe pain. Patient not taking: Reported on 03/19/2015 09/02/14   Marisa Severintter, Olga, MD  ipratropium (ATROVENT) 0.06 % nasal spray Place 2 sprays into both nostrils 4 (four) times daily. 07/08/17   Deatra Canterxford, William J, FNP   Meds Ordered and Administered this Visit  Medications - No data to display  BP (!) 141/74 (BP Location: Right Arm)   Pulse 74   Temp 98.9 F (37.2 C) (Oral)   Resp 18   SpO2 98%  No data found.   Physical Exam  Constitutional: He is oriented to person, place, and time. He appears well-developed and well-nourished.  HENT:  Head: Normocephalic and atraumatic.  Right Ear: External ear normal.  Left Ear: External ear normal.  Mouth/Throat: Oropharynx is clear and moist.  Eyes: Conjunctivae and EOM are normal. Pupils are equal, round, and reactive to light.  Neck: Normal range of motion. Neck supple.  Cardiovascular: Normal rate, regular rhythm and normal heart sounds.   Pulmonary/Chest: Effort normal and breath sounds normal.  Neurological: He is alert and oriented to person, place, and time.  Nursing note and vitals reviewed.   Urgent Care Course     Procedures (including critical care time)  Labs Review Labs Reviewed - No data to display  Imaging Review No results found.   Visual Acuity Review  Right Eye Distance:   Left Eye Distance:   Bilateral Distance:  Right Eye Near:   Left Eye Near:    Bilateral Near:         MDM   1. Acute bronchitis due to other specified organisms   2. Cough    zpak Tessalon Push po fluids, rest, tylenol and motrin otc prn as directed for fever, arthralgias, and myalgias.  Follow up prn if sx's continue or persist.    Deatra Canter, FNP 07/09/17 1013

## 2018-04-06 ENCOUNTER — Ambulatory Visit (HOSPITAL_COMMUNITY)
Admission: EM | Admit: 2018-04-06 | Discharge: 2018-04-06 | Disposition: A | Discharge status: Home / Self Care | Payer: Self-pay | Attending: Family Medicine | Admitting: Family Medicine

## 2018-04-06 ENCOUNTER — Encounter (HOSPITAL_COMMUNITY): Payer: Self-pay | Admitting: Emergency Medicine

## 2018-04-06 ENCOUNTER — Other Ambulatory Visit: Payer: Self-pay

## 2018-04-06 DIAGNOSIS — R0989 Other specified symptoms and signs involving the circulatory and respiratory systems: Secondary | ICD-10-CM

## 2018-04-06 DIAGNOSIS — J309 Allergic rhinitis, unspecified: Secondary | ICD-10-CM

## 2018-04-06 MED ORDER — CETIRIZINE HCL 10 MG PO TABS: 10.0000 mg | ORAL_TABLET | Freq: Every day | ORAL | 0 refills | Status: DC

## 2018-04-06 MED ORDER — FLUTICASONE PROPIONATE 50 MCG/ACT NA SUSP: 2.0000 | Freq: Every day | NASAL | 0 refills | Status: DC

## 2018-04-06 MED ORDER — IPRATROPIUM BROMIDE 0.06 % NA SOLN: 2.0000 | Freq: Four times a day (QID) | NASAL | 0 refills | Status: DC

## 2018-04-06 NOTE — ED Triage Notes (Signed)
Patient attempted to donate plasma.  Nurse at that sight had concerns for "breath sounds".  Wants clearance for donating plasma.  Patient denies any cough, cold symptoms, or runny nose

## 2018-04-06 NOTE — ED Provider Notes (Signed)
[facility]    CSN: [id number] Arrival date & time: 04/06/18  [id number]     History   Chief Complaint Chief Complaint  Patient presents with  . Letter for School/Work    HPI Rogue BussingJamal Vega is a 22 y.o. male.   22 year old male comes in for clearance for plasma donation.  States a nurse evaluated him and told him he has "abnormal breath sounds" and to come in for evaluation.  He denies fever, chills, night sweats.  States he has seasonal allergies with rhinorrhea and nasal congestion.  Denies sore throat, cough, shortness of breath, wheezing, chest pain.  Has been taking Benadryl with good relief of symptoms. Has been eating and drinking without problems. Never smoker.      History reviewed. No pertinent past medical history.  There are no active problems to display for this patient.   Past Surgical History:  Procedure Laterality Date  . ADENOIDECTOMY    . TONSILLECTOMY         Home Medications    Prior to Admission medications   Medication Sig Start Date End Date Taking? Authorizing Provider  cetirizine (ZYRTEC) 10 MG tablet Take 1 tablet (10 mg total) by mouth daily. 04/06/18   Cathie HoopsYu, Amy V, PA-C  fluticasone (FLONASE) 50 MCG/ACT nasal spray Place 2 sprays into both nostrils daily. 04/06/18   Cathie HoopsYu, Amy V, PA-C  ipratropium (ATROVENT) 0.06 % nasal spray Place 2 sprays into both nostrils 4 (four) times daily. 04/06/18   Belinda FisherYu, Amy V, PA-C    Family History Family History  Problem Relation Age of Onset  . Diabetes Father     Social History Social History   Tobacco Use  . Smoking status: Never Smoker  . Smokeless tobacco: Never Used  Substance Use Topics  . Alcohol use: No  . Drug use: No     Allergies   Patient has no known allergies.   Review of Systems Review of Systems  Reason unable to perform ROS: See HPI as above.     Physical Exam Triage Vital Signs ED Triage Vitals  Enc Vitals Group     BP 04/06/18 1014 136/75     Pulse Rate 04/06/18 1014 81       Resp 04/06/18 1014 20     Temp 04/06/18 1014 97.7 F (36.5 C)     Temp Source 04/06/18 1014 Oral     SpO2 04/06/18 1014 99 %     Weight --      Height --      Head Circumference --      Peak Flow --      Pain Score 04/06/18 1013 0     Pain Loc --      Pain Edu? --      Excl. in GC? --    No data found.  Updated Vital Signs BP 136/75 (BP Location: Left Arm) Comment (BP Location): large cuff  Pulse 81   Temp 97.7 F (36.5 C) (Oral)   Resp 20   SpO2 99%   Physical Exam  Constitutional: He is oriented to person, place, and time. He appears well-developed and well-nourished. No distress.  HENT:  Head: Normocephalic and atraumatic.  Right Ear: Tympanic membrane, external ear and ear canal normal. Tympanic membrane is not erythematous and not bulging.  Left Ear: Tympanic membrane, external ear and ear canal normal. Tympanic membrane is not erythematous and not bulging.  Nose: Nose normal. Right sinus exhibits no maxillary sinus tenderness and no frontal  sinus tenderness. Left sinus exhibits no maxillary sinus tenderness and no frontal sinus tenderness.  Mouth/Throat: Uvula is midline, oropharynx is clear and moist and mucous membranes are normal.  Eyes: Pupils are equal, round, and reactive to light. Conjunctivae are normal.  Neck: Normal range of motion. Neck supple.  Cardiovascular: Normal rate, regular rhythm and normal heart sounds. Exam reveals no gallop and no friction rub.  No murmur heard. Pulmonary/Chest: Effort normal and breath sounds normal. He has no decreased breath sounds. He has no wheezes. He has no rhonchi. He has no rales.  Lymphadenopathy:    He has no cervical adenopathy.  Neurological: He is alert and oriented to person, place, and time.  Skin: Skin is warm and dry.  Psychiatric: He has a normal mood and affect. His behavior is normal. Judgment normal.     UC Treatments / Results  Labs (all labs ordered are listed, but only abnormal results are  displayed) Labs Reviewed - No data to display  EKG None Radiology No results found.  Procedures Procedures (including critical care time)  Medications Ordered in UC Medications - No data to display   Initial Impression / Assessment and Plan / UC Course  I have reviewed the triage vital signs and the nursing notes.  Pertinent labs & imaging results that were available during my care of the patient were reviewed by me and considered in my medical decision making (see chart for details).    Normal exam.  Given patient with history of allergic rhinitis, will provide symptomatic treatment as needed.  Patient cleared for plasma donation.  Return precautions given.  Patient expresses understanding and agrees to plan.  Final Clinical Impressions(s) / UC Diagnoses   Final diagnoses:  Allergic rhinitis, unspecified seasonality, unspecified trigger    ED Discharge Orders        Ordered    fluticasone (FLONASE) 50 MCG/ACT nasal spray  Daily     04/06/18 1040    ipratropium (ATROVENT) 0.06 % nasal spray  4 times daily     04/06/18 1040    cetirizine (ZYRTEC) 10 MG tablet  Daily     04/06/18 1040       Belinda Fisher, [place] 04/06/18 1047

## 2018-04-06 NOTE — ED Notes (Signed)
Returned "csl plasma" form to patient.  Made front and back copy of form for scanning into medical record.

## 2018-04-06 NOTE — Discharge Instructions (Signed)
Normal exam, cleared to donate plasma. As discussed, abnormal breath sounds could be coming from your nose when you have congestion. Given history of seasonal allergies, start zyrtec, flonase, atrovent nasal spray to help with symptoms. I have called the medicines in, you can go on goodrx to find discounts for the medicine. Monitor for any fever, cough, shortness of breath, wheezing, chest pain, swelling of the throat, follow up for reevaluation.

## 2018-05-19 ENCOUNTER — Encounter (HOSPITAL_COMMUNITY): Payer: Self-pay | Admitting: Family Medicine

## 2018-05-19 ENCOUNTER — Ambulatory Visit (INDEPENDENT_AMBULATORY_CARE_PROVIDER_SITE_OTHER): Payer: Self-pay

## 2018-05-19 ENCOUNTER — Ambulatory Visit (HOSPITAL_COMMUNITY)
Admission: EM | Admit: 2018-05-19 | Discharge: 2018-05-19 | Disposition: A | Discharge status: Home / Self Care | Payer: Self-pay | Attending: Family Medicine | Admitting: Family Medicine

## 2018-05-19 DIAGNOSIS — S60851A Superficial foreign body of right wrist, initial encounter: Secondary | ICD-10-CM

## 2018-05-19 DIAGNOSIS — M25531 Pain in right wrist: Secondary | ICD-10-CM

## 2018-05-19 NOTE — Discharge Instructions (Signed)
There appears to be a small fragment under the skin, but it is not obvious and we do not have the equipment to remove this tonight.  You need to make an appointment with a hand surgeon listed below.  He will have the proper equipment, lighting, and anesthesia to have a good outcome.

## 2018-05-19 NOTE — ED Provider Notes (Signed)
[facility]   [id number] 05/19/18 Arrival Time: 1841   SUBJECTIVE:  Jeff Vega is a 22 y.o. male who presents to the urgent care with complaint of soreness right wrist over the medial radius for 3 weeks following laceration involving glass window.  Patient believes that there may be a foreign body under the healed skin.    History reviewed. No pertinent past medical history. Family History  Problem Relation Age of Onset  . Diabetes Father    Social History   Socioeconomic History  . Marital status: Single    Spouse name: Not on file  . Number of children: Not on file  . Years of education: Not on file  . Highest education level: Not on file  Occupational History  . Not on file  Social Needs  . Financial resource strain: Not on file  . Food insecurity:    Worry: Not on file    Inability: Not on file  . Transportation needs:    Medical: Not on file    Non-medical: Not on file  Tobacco Use  . Smoking status: Never Smoker  . Smokeless tobacco: Never Used  Substance and Sexual Activity  . Alcohol use: No  . Drug use: No  . Sexual activity: Not on file  Lifestyle  . Physical activity:    Days per week: Not on file    Minutes per session: Not on file  . Stress: Not on file  Relationships  . Social connections:    Talks on phone: Not on file    Gets together: Not on file    Attends religious service: Not on file    Active member of club or organization: Not on file    Attends meetings of clubs or organizations: Not on file    Relationship status: Not on file  . Intimate partner violence:    Fear of current or ex partner: Not on file    Emotionally abused: Not on file    Physically abused: Not on file    Forced sexual activity: Not on file  Other Topics Concern  . Not on file  Social History Narrative  . Not on file   No outpatient medications have been marked as taking for the 05/19/18 encounter [facility] Encounter).   No Known  Allergies    ROS: As per HPI, remainder of ROS negative.   OBJECTIVE:   Vitals:   05/19/18 1918  BP: 122/78  Pulse: 84  Resp: 18  Temp: 98.3 F (36.8 C)  SpO2: 100%     General appearance: alert; no distress Eyes: PERRL; EOMI; conjunctiva normal HENT: normocephalic; atraumatic;  oral mucosa normal Neck: supple Back: no CVA tenderness Extremities: no cyanosis or edema; well-healed laceration over radial styloid of right wrist with subcutaneous nodule Skin: warm and dry Neurologic: normal gait; grossly normal Psychological: alert and cooperative; normal mood and affect    Labs:  Results for orders placed or performed during the hospital encounter of 03/19/15  CBC with Differential  Result Value Ref Range   WBC 7.4 4.0 - 10.5 K/uL   RBC 5.40 4.22 - 5.81 MIL/uL   Hemoglobin 15.8 13.0 - 17.0 g/dL   HCT 40.9 81.1 - 91.4 %   MCV 84.4 78.0 - 100.0 fL   MCH 29.3 26.0 - 34.0 pg   MCHC 34.6 30.0 - 36.0 g/dL   RDW 78.2 95.6 - 21.3 %   Platelets 255 150 - 400 K/uL   Neutrophils Relative % 64 43 -  77 %   Neutro Abs 4.8 1.7 - 7.7 K/uL   Lymphocytes Relative 28 12 - 46 %   Lymphs Abs 2.0 0.7 - 4.0 K/uL   Monocytes Relative 7 3 - 12 %   Monocytes Absolute 0.5 0.1 - 1.0 K/uL   Eosinophils Relative 1 0 - 5 %   Eosinophils Absolute 0.1 0.0 - 0.7 K/uL   Basophils Relative 0 0 - 1 %   Basophils Absolute 0.0 0.0 - 0.1 K/uL  Comprehensive metabolic panel  Result Value Ref Range   Sodium 139 135 - 145 mmol/L   Potassium 4.8 3.5 - 5.1 mmol/L   Chloride 103 96 - 112 mmol/L   CO2 29 19 - 32 mmol/L   Glucose, Bld 124 (H) 70 - 99 mg/dL   BUN 8 6 - 23 mg/dL   Creatinine, Ser 1.61 0.50 - 1.35 mg/dL   Calcium 9.8 8.4 - 09.6 mg/dL   Total Protein 8.1 6.0 - 8.3 g/dL   Albumin 4.7 3.5 - 5.2 g/dL   AST 49 (H) 0 - 37 U/L   ALT 17 0 - 53 U/L   Alkaline Phosphatase 54 39 - 117 U/L   Total Bilirubin 1.2 0.3 - 1.2 mg/dL   GFR calc non Af Amer >90 >90 mL/min   GFR calc Af Amer >90 >90  mL/min   Anion gap 7 5 - 15  Lipase, blood  Result Value Ref Range   Lipase 24 11 - 59 U/L    Labs Reviewed - No data to display  No results found.     ASSESSMENT & PLAN:  1. Acute foreign body of right wrist, initial encounter   There appears to be a small fragment under the skin, but it is not obvious and we do not have the equipment to remove this tonight.  You need to make an appointment with a hand surgeon listed below.  He will have the proper equipment, lighting, and anesthesia to have a good outcome.  No orders of the defined types were placed in this encounter.   Reviewed expectations re: course of current medical issues. Questions answered. Outlined signs and symptoms indicating need for more acute intervention. Patient verbalized understanding. After Visit Summary given.    Procedures:      Elvina Sidle, MD 05/19/18 2009

## 2018-05-19 NOTE — ED Triage Notes (Signed)
Pt here for possible piece of glass in right wrist. He sts x 2 weeks ago. He was breaking a window when this occurred.

## 2018-06-26 ENCOUNTER — Emergency Department (HOSPITAL_COMMUNITY)
Admission: EM | Admit: 2018-06-26 | Discharge: 2018-06-26 | Disposition: A | Discharge status: Home / Self Care | Payer: Medicaid Other | Attending: Emergency Medicine | Admitting: Emergency Medicine

## 2018-06-26 ENCOUNTER — Encounter (HOSPITAL_COMMUNITY): Payer: Self-pay | Admitting: Emergency Medicine

## 2018-06-26 DIAGNOSIS — W25XXXA Contact with sharp glass, initial encounter: Secondary | ICD-10-CM | POA: Insufficient documentation

## 2018-06-26 DIAGNOSIS — S60851A Superficial foreign body of right wrist, initial encounter: Secondary | ICD-10-CM | POA: Insufficient documentation

## 2018-06-26 DIAGNOSIS — S60851D Superficial foreign body of right wrist, subsequent encounter: Secondary | ICD-10-CM

## 2018-06-26 DIAGNOSIS — Y939 Activity, unspecified: Secondary | ICD-10-CM | POA: Insufficient documentation

## 2018-06-26 DIAGNOSIS — Y929 Unspecified place or not applicable: Secondary | ICD-10-CM | POA: Insufficient documentation

## 2018-06-26 DIAGNOSIS — Y999 Unspecified external cause status: Secondary | ICD-10-CM | POA: Insufficient documentation

## 2018-06-26 MED ORDER — LIDOCAINE-EPINEPHRINE (PF) 2 %-1:200000 IJ SOLN
10.0000 mL | Freq: Once | INTRAMUSCULAR | Status: AC
  Administered 2018-06-26: 10 mL
  Filled 2018-06-26: qty 20

## 2018-06-26 MED ORDER — CEPHALEXIN 500 MG PO CAPS: 500.0000 mg | ORAL_CAPSULE | Freq: Four times a day (QID) | ORAL | 0 refills | Status: AC

## 2018-06-26 MED ORDER — TETANUS-DIPHTH-ACELL PERTUSSIS 5-2.5-18.5 LF-MCG/0.5 IM SUSP
0.5000 mL | Freq: Once | INTRAMUSCULAR | Status: AC
  Administered 2018-06-26: 0.5 mL via INTRAMUSCULAR
  Filled 2018-06-26: qty 0.5

## 2018-06-26 NOTE — Discharge Instructions (Addendum)
Thank you for allowing me to provide your care today in the Emergency Department.   To care for your wound at home, clean the area once daily with warm water and soap.  Then apply a thin layer of topical antibiotic such as bacitracin or Neosporin and cover the wound with a bandage.  Repeat this daily until the wound closes.  Take 1 tablet of Keflex every 6 hours for the next 7 days.  This is an antibiotic to prevent infection.  Your tetanus was updated today in the emergency department.  You can call the number on your discharge paperwork to get established with a primary care provider that takes Medicaid.  You can follow-up with primary care if you continue to have symptoms of sodium associated with your right wrist.  Take 650 mg of Tylenol or 600 mg of ibuprofen with food every 6 hours for pain control.  Return to the emergency department if you develop a fever, chills, severe redness, swelling, warmth to the area, or other new concerning symptoms.

## 2018-06-26 NOTE — ED Provider Notes (Signed)
[facility] EMERGENCY DEPARTMENT Provider Note   CSN: [id number] Arrival date & time: 06/26/18  0754     History   Chief Complaint Chief Complaint  Patient presents with  . Foreign Body    HPI Jeff Vega is a 22 y.o. male with no pertinent past medical history who presents to the emergency department with a chief complaint of foreign body in right wrist.  The patient reports that he was seen at urgent care on May 19, 2018 with soreness to the right wrist for 3 weeks after he sustained a laceration to the right wrist involving a glass window.  X-ray at that time was negative for foreign body.  He was discharged home with follow-up to the hand surgeon for possible foreign body removal.  The patient reports that he called the hand surgeon, but was unable to afford the co-pay and was advised to come to the emergency department.  He reports pain to the right wrist that is worse with ulnar deviation.  No fevers, chills, redness, swelling, or edema to the area.  The history is provided by the patient. No language interpreter was used.    History reviewed. No pertinent past medical history.  There are no active problems to display for this patient.   Past Surgical History:  Procedure Laterality Date  . ADENOIDECTOMY    . TONSILLECTOMY          Home Medications    Prior to Admission medications   Medication Sig Start Date End Date Taking? Authorizing Provider  cephALEXin (KEFLEX) 500 MG capsule Take 1 capsule (500 mg total) by mouth 4 (four) times daily for 7 days. 06/26/18 07/03/18  Abrahm Mancia, Coral Else, PA-C    Family History Family History  Problem Relation Age of Onset  . Diabetes Father     Social History Social History   Tobacco Use  . Smoking status: Never Smoker  . Smokeless tobacco: Never Used  Substance Use Topics  . Alcohol use: No  . Drug use: No     Allergies   Patient has no known allergies.   Review of Systems Review of Systems    Constitutional: Negative for activity change.  Respiratory: Negative for shortness of breath.   Cardiovascular: Negative for chest pain.  Gastrointestinal: Negative for abdominal pain.  Musculoskeletal: Negative for back pain.  Skin: Positive for wound. Negative for rash.     Physical Exam Updated Vital Signs BP 125/72 (BP Location: Right Arm)   Pulse (!) 59   Temp 98.7 F (37.1 C) (Oral)   Resp 16   SpO2 100%   Physical Exam  Constitutional: He appears well-developed.  HENT:  Head: Normocephalic.  Eyes: Conjunctivae are normal.  Neck: Neck supple.  Cardiovascular: Normal rate and regular rhythm.  No murmur heard. Pulmonary/Chest: Effort normal.  Abdominal: Soft. He exhibits no distension.  Musculoskeletal:  0.5 cm well-healing scar to the radial lateral aspect of the right wrist with some hyperkeratosis.  No surrounding erythema, edema, warmth, or ecchymosis.  No palpable foreign body.  Radial pulses are 2+ and symmetric.  Full active and passive range of motion of the right hand and wrist.  Neurological: He is alert.  Skin: Skin is warm and dry.  Psychiatric: His behavior is normal.  Nursing note and vitals reviewed.    ED Treatments / Results  Labs (all labs ordered are listed, but only abnormal results are displayed) Labs Reviewed - No data to display  EKG None  Radiology No  results found.  Procedures  EMERGENCY DEPARTMENT US SOFT TISSUE INTERPRETATION "Study: Limited Soft Tissue Ultrasound"  INDICATIONS: Pain Multiple views of the body part were obtained in real-time with a multi-frequency linear probe  PERFORMED BY: Myself IMAGES ARCHIVED?: Yes SIDE:Right  BODY PART:wrist INTERPRETATION:  Questionable foreign body fragment     .Foreign Body Removal Date/Time: 06/26/2018 4:32 PM Performed by: Barkley BoardsMcDonald, [place] Zou A, PA-C Authorized by: Barkley BoardsMcDonald, Carleen Rhue A, PA-C  Consent: Verbal consent obtained. Consent given by: patient Patient understanding: patient  states understanding of the procedure being performed Patient consent: the patient's understanding of the procedure matches consent given Patient identity confirmed: verbally with patient Intake: right wrist  Anesthesia: local infiltration  Anesthesia: Local Anesthetic: lidocaine 2% with epinephrine Complexity: complex 0 objects recovered. Post-procedure assessment: foreign body not removed Patient tolerance: Patient tolerated the procedure well with no immediate complications   (including critical care time)  Medications Ordered in ED Medications  lidocaine-EPINEPHrine (XYLOCAINE W/EPI) 2 %-1:200000 (PF) injection 10 mL (10 mLs Infiltration Given 06/26/18 0940)  Tdap (BOOSTRIX) injection 0.5 mL (0.5 mLs Intramuscular Given 06/26/18 1110)     Initial Impression / Assessment and Plan / ED Course  I have reviewed the triage vital signs and the nursing notes.  Pertinent labs & imaging results that were available during my care of the patient were reviewed by me and considered in my medical decision making (see chart for details).     22 year old male with no pertinent past medical history who presents to the emergency department with concern for foreign body in the right wrist.  X-ray reviewed from previous urgent care visit which was negative for radiolucent foreign body.  On bedside ultrasound, there was a questionable fragment that was initially seen.  An H shaped laceration was made, approximately 0.5 cm and length.  Care was used not to go to deep given the location and proximity to tendons, ligaments, and neurovascular structures.  The incision was made approximately 0.5 cm superior to the right radial artery.  Incision was through the depth of the hyperkeratotic skin over the well-healing laceration.  No foreign body was recovered on exam and the patient was seen and evaluated by Dr. Silverio LayYao who repeated bedside ultrasound and did not visualize any foreign bodies on repeat ultrasound.  We  will discharge the patient home with a short course of Keflex given the procedure today.  He was given strict wound care precautions and Tdap was updated.  He has also been advised to get established with a primary care provider for follow-up.  Strict return precautions given.  He is hemodynamically stable in no acute distress.  He is safe for discharge home at this time.  Final Clinical Impressions(s) / ED Diagnoses   Final diagnoses:  Acute foreign body of right wrist, subsequent encounter    ED Discharge Orders        Ordered    cephALEXin (KEFLEX) 500 MG capsule  4 times daily     06/26/18 1100       Kobie Whidby A, PA-C 06/26/18 1636    Charlynne PanderYao, David Hsienta, MD 06/29/18 [phone]

## 2018-06-26 NOTE — ED Triage Notes (Signed)
Pt reports getting a piece of glass in his right wrist about 1 month ago, was seen at urgent care 2 weeks after incident occurred and was referred to a hand surgeon but patient states he did not have insurance that the hand surgeon would accept so patient got financial assistance through [facility] but still was not able to be seen at the hand specialists and was told to come back here.

## 2019-08-19 ENCOUNTER — Other Ambulatory Visit: Payer: Self-pay

## 2019-08-19 DIAGNOSIS — Z20822 Contact with and (suspected) exposure to covid-19: Secondary | ICD-10-CM

## 2019-08-20 LAB — NOVEL CORONAVIRUS, NAA: SARS-CoV-2, NAA: NOT DETECTED

## 2019-09-22 ENCOUNTER — Ambulatory Visit (INDEPENDENT_AMBULATORY_CARE_PROVIDER_SITE_OTHER): Payer: Self-pay | Admitting: Primary Care

## 2019-09-29 ENCOUNTER — Encounter (INDEPENDENT_AMBULATORY_CARE_PROVIDER_SITE_OTHER): Payer: Self-pay | Admitting: Primary Care

## 2019-09-29 ENCOUNTER — Ambulatory Visit (INDEPENDENT_AMBULATORY_CARE_PROVIDER_SITE_OTHER): Payer: BC Managed Care – PPO | Admitting: Primary Care

## 2019-09-29 ENCOUNTER — Other Ambulatory Visit: Payer: Self-pay

## 2019-09-29 DIAGNOSIS — M545 Low back pain, unspecified: Secondary | ICD-10-CM

## 2019-09-29 DIAGNOSIS — Z7689 Persons encountering health services in other specified circumstances: Secondary | ICD-10-CM

## 2019-09-29 DIAGNOSIS — F4323 Adjustment disorder with mixed anxiety and depressed mood: Secondary | ICD-10-CM

## 2019-09-29 MED ORDER — IBUPROFEN 800 MG PO TABS: 600.0000 mg | ORAL_TABLET | Freq: Three times a day (TID) | ORAL | 0 refills | Status: DC | PRN

## 2019-09-29 NOTE — Progress Notes (Signed)
Virtual Visit via Telephone Note  I connected with Jeff Vega on 09/29/19 at  3:50 PM EDT by telephone and verified that I am speaking with the correct person using two identifiers.   I discussed the limitations, risks, security and privacy concerns of performing an evaluation and management service by telephone and the availability of in person appointments. I also discussed with the patient that there may be a patient responsible charge related to this service. The patient expressed understanding and agreed to proceed.   History of Present Illness: [name] is having a tele visit today to establish care and voice concerns about low back pains that travels dow his legs he denies loss of bowel or bladder. He is also experiencing episodes of anxiety and depression. Patient works at [employer] which may contribute to back pain.  No past medical history on file. Observations/Objective: Review of Systems  Musculoskeletal: Positive for back pain.  Psychiatric/Behavioral: Positive for depression. The patient is nervous/anxious.   All other systems reviewed and are negative.   Assessment and Plan: Jeff Vega was seen today for new patient (initial visit) and back pain.  Diagnoses and all orders for this visit:  Encounter to establish care Juluis Mire, NP-C will be your  (PCP) that will  provides both the first contact for a person with an undiagnosed health concern as well as continuing care of varied medical conditions, not limited by cause, organ system, or diagnosis.   Adjustment disorder with mixed anxiety and depressed mood Scheduled follow up visit in person to see Clinical Social worker and follow up with provider at that time discuss treatment options.  Bilateral low back pain, unspecified chronicity, unspecified whether sciatica present Patient is working lifting , bending and pulling but the radiation going down leg is concerning except it has been  happening for a while. In person evaluation to determine referral. Advise if increase pain loss of body function go to ED. Patient verbalized understanding.   Follow Up Instructions:    I discussed the assessment and treatment plan with the patient. The patient was provided an opportunity to ask questions and all were answered. The patient agreed with the plan and demonstrated an understanding of the instructions.   The patient was advised to call back or seek an in-person evaluation if the symptoms worsen or if the condition fails to improve as anticipated.  I provided 22 minutes of non-face-to-face time during this encounter.   Jeff Perna, NP

## 2019-10-11 ENCOUNTER — Other Ambulatory Visit: Payer: Self-pay

## 2019-10-11 DIAGNOSIS — Z20822 Contact with and (suspected) exposure to covid-19: Secondary | ICD-10-CM

## 2019-10-12 LAB — NOVEL CORONAVIRUS, NAA: SARS-CoV-2, NAA: NOT DETECTED

## 2019-10-14 ENCOUNTER — Encounter (INDEPENDENT_AMBULATORY_CARE_PROVIDER_SITE_OTHER): Payer: Self-pay | Admitting: Primary Care

## 2019-10-21 ENCOUNTER — Encounter (INDEPENDENT_AMBULATORY_CARE_PROVIDER_SITE_OTHER): Payer: Self-pay | Admitting: Primary Care

## 2019-10-21 ENCOUNTER — Ambulatory Visit: Payer: BC Managed Care – PPO | Attending: Primary Care | Admitting: Licensed Clinical Social Worker

## 2019-10-21 ENCOUNTER — Other Ambulatory Visit: Payer: Self-pay

## 2019-10-21 ENCOUNTER — Ambulatory Visit (INDEPENDENT_AMBULATORY_CARE_PROVIDER_SITE_OTHER): Payer: BC Managed Care – PPO | Admitting: Primary Care

## 2019-10-21 VITALS — BP 131/92 | HR 55 | Temp 97.3°F | Ht 72.0 in | Wt 262.6 lb

## 2019-10-21 DIAGNOSIS — G4700 Insomnia, unspecified: Secondary | ICD-10-CM

## 2019-10-21 DIAGNOSIS — F321 Major depressive disorder, single episode, moderate: Secondary | ICD-10-CM | POA: Diagnosis not present

## 2019-10-21 DIAGNOSIS — F4323 Adjustment disorder with mixed anxiety and depressed mood: Secondary | ICD-10-CM | POA: Diagnosis not present

## 2019-10-21 DIAGNOSIS — F411 Generalized anxiety disorder: Secondary | ICD-10-CM

## 2019-10-21 DIAGNOSIS — M545 Low back pain, unspecified: Secondary | ICD-10-CM

## 2019-10-21 MED ORDER — ESCITALOPRAM OXALATE 10 MG PO TABS: 10.0000 mg | ORAL_TABLET | Freq: Every day | ORAL | 2 refills | Status: DC

## 2019-10-21 NOTE — Progress Notes (Signed)
Established Patient Office Visit  Subjective:  Patient ID: Jeff Vega, male    DOB: [birth year]  Age: 23 y.o. MRN: [id number]  CC:  Chief Complaint  Patient presents with  . Follow-up    anxiety  . Back Pain    lower back, when standing for long periods of time    HPI Jeff Vega presents for follow up on anxiety, depression and insomnia. He first had a clinical session with CSW and they have set up visits. Also, concernes with low back pain.  No past medical history on file.  Past Surgical History:  Procedure Laterality Date  . ADENOIDECTOMY    . TONSILLECTOMY      Family History  Problem Relation Age of Onset  . Diabetes Father     Social History   Socioeconomic History  . Marital status: Single    Spouse name: Not on file  . Number of children: Not on file  . Years of education: Not on file  . Highest education level: Not on file  Occupational History  . Not on file  Social Needs  . Financial resource strain: Not on file  . Food insecurity    Worry: Not on file    Inability: Not on file  . Transportation needs    Medical: Not on file    Non-medical: Not on file  Tobacco Use  . Smoking status: Never Smoker  . Smokeless tobacco: Never Used  Substance and Sexual Activity  . Alcohol use: No  . Drug use: No  . Sexual activity: Not on file  Lifestyle  . Physical activity    Days per week: Not on file    Minutes per session: Not on file  . Stress: Not on file  Relationships  . Social Musician on phone: Not on file    Gets together: Not on file    Attends religious service: Not on file    Active member of club or organization: Not on file    Attends meetings of clubs or organizations: Not on file    Relationship status: Not on file  . Intimate partner violence    Fear of current or ex partner: Not on file    Emotionally abused: Not on file    Physically abused: Not on file    Forced sexual activity: Not on file  Other Topics Concern   . Not on file  Social History Narrative  . Not on file    Outpatient Medications Prior to Visit  Medication Sig Dispense Refill  . ibuprofen (ADVIL) 800 MG tablet Take 1 tablet (800 mg total) by mouth every 8 (eight) hours as needed. 90 tablet 0   No facility-administered medications prior to visit.     No Known Allergies  ROS Review of Systems  Musculoskeletal: Positive for back pain.  Psychiatric/Behavioral: Positive for sleep disturbance. The patient is nervous/anxious.        Depression  All other systems reviewed and are negative.     Objective:    Physical Exam  Constitutional: He is oriented to person, place, and time. He appears well-developed and well-nourished.  Neck: Neck supple.  Cardiovascular: Normal rate and regular rhythm.  Pulmonary/Chest: Effort normal and breath sounds normal.  Abdominal: Soft. Bowel sounds are normal. He exhibits distension.  Neurological: He is oriented to person, place, and time.  Skin: Skin is warm and dry.  Psychiatric: He has a normal mood and affect.    BP (!) 131/92 (  BP Location: Left Arm, Patient Position: Sitting, Cuff Size: Large)   Pulse (!) 55   Temp (!) 97.3 F (36.3 C) (Temporal)   Ht 6' (1.829 m)   Wt 262 lb 9.6 oz (119.1 kg)   SpO2 92%   BMI 35.61 kg/m  Wt Readings from Last 3 Encounters:  10/21/19 262 lb 9.6 oz (119.1 kg)  03/19/15 212 lb (96.2 kg)  09/02/14 218 lb 3 oz (99 kg)     Health Maintenance Due  Topic Date Due  . HIV Screening  01/12/2009    There are no preventive care reminders to display for this patient.  No results found for: TSH Lab Results  Component Value Date   WBC 7.4 03/19/2015   HGB 15.8 03/19/2015   HCT 45.6 03/19/2015   MCV 84.4 03/19/2015   PLT 255 03/19/2015   Lab Results  Component Value Date   NA 139 03/19/2015   K 4.8 03/19/2015   CO2 29 03/19/2015   GLUCOSE 124 (H) 03/19/2015   BUN 8 03/19/2015   CREATININE 1.14 03/19/2015   BILITOT 1.2 03/19/2015    ALKPHOS 54 03/19/2015   AST 49 (H) 03/19/2015   ALT 17 03/19/2015   PROT 8.1 03/19/2015   ALBUMIN 4.7 03/19/2015   CALCIUM 9.8 03/19/2015   ANIONGAP 7 03/19/2015   No results found for: CHOL No results found for: HDL No results found for: LDLCALC No results found for: TRIG No results found for: CHOLHDL No results found for: HGBA1C    Assessment & Plan:  Jeff Vega was seen today for follow-up and back pain.  Diagnoses and all orders for this visit:  Adjustment disorder with mixed anxiety and depressed mood Depression has affected thoughts , feelings, relationships and  daily activities. Patient is in agreement with weekly visit with CSW and medication therapy. Low dose of SSRI Lexapro 10mg  at night  Bilateral low back pain, unspecified chronicity, unspecified whether sciatica present BACK PAIN  Location: lower right and left side no pain in the middle. Left and right pain is simultaneously Quality: 8/10  Onset:gradual if standing for a long time Worse with: standing     Better with: laying or sitting down Radiation: no Trauma: no Best sitting/standing/leaning forward: yes  Red Flags Fecal/urinary incontinence: no}  Numbness/Weakness:no Fever/chills/sweats: no Night pain: no Unexplained weight loss:no No relief with bedrest:yes h/o cancer/immunosuppression:no IV drug use:no PMH of osteoporosis or chronic steroid use: no   Insomnia, unspecified type Wakes up 3-4 times interrupted sleep leaving fatigue in the morning. Lexapro may help with insomnia and information on AVS for addition information  No orders of the defined types were placed in this encounter.   Follow-up: No follow-ups on file.    Kerin Perna, NP

## 2019-10-21 NOTE — Patient Instructions (Signed)
Can try melatonin 5mg-15 mg at night for sleep, can also do benadryl 25-50mg at night for sleep.  If this does not help we can try prescription medication.  Also here is some information about good sleep hygiene.   Insomnia Insomnia is frequent trouble falling and/or staying asleep. Insomnia can be a long term problem or a short term problem. Both are common. Insomnia can be a short term problem when the wakefulness is related to a certain stress or worry. Long term insomnia is often related to ongoing stress during waking hours and/or poor sleeping habits. Overtime, sleep deprivation itself can make the problem worse. Every little thing feels more severe because you are overtired and your ability to cope is decreased. CAUSES  Stress, anxiety, and depression. Poor sleeping habits. Distractions such as TV in the bedroom. Naps close to bedtime. Engaging in emotionally charged conversations before bed. Technical reading before sleep. Alcohol and other sedatives. They may make the problem worse. They can hurt normal sleep patterns and normal dream activity. Stimulants such as caffeine for several hours prior to bedtime. Pain syndromes and shortness of breath can cause insomnia. Exercise late at night. Changing time zones may cause sleeping problems (jet lag). It is sometimes helpful to have someone observe your sleeping patterns. They should look for periods of not breathing during the night (sleep apnea). They should also look to see how long those periods last. If you live alone or observers are uncertain, you can also be observed at a sleep clinic where your sleep patterns will be professionally monitored. Sleep apnea requires a checkup and treatment. Give your caregivers your medical history. Give your caregivers observations your family has made about your sleep.  SYMPTOMS  Not feeling rested in the morning. Anxiety and restlessness at bedtime. Difficulty falling and staying asleep. TREATMENT   Your caregiver may prescribe treatment for an underlying medical disorders. Your caregiver can give advice or help if you are using alcohol or other drugs for self-medication. Treatment of underlying problems will usually eliminate insomnia problems. Medications can be prescribed for short time use. They are generally not recommended for lengthy use. Over-the-counter sleep medicines are not recommended for lengthy use. They can be habit forming. You can promote easier sleeping by making lifestyle changes such as: Using relaxation techniques that help with breathing and reduce muscle tension. Exercising earlier in the day. Changing your diet and the time of your last meal. No night time snacks. Establish a regular time to go to bed. Counseling can help with stressful problems and worry. Soothing music and white noise may be helpful if there are background noises you cannot remove. Stop tedious detailed work at least one hour before bedtime. HOME CARE INSTRUCTIONS  Keep a diary. Inform your caregiver about your progress. This includes any medication side effects. See your caregiver regularly. Take note of: Times when you are asleep. Times when you are awake during the night. The quality of your sleep. How you feel the next day. This information will help your caregiver care for you. Get out of bed if you are still awake after 15 minutes. Read or do some quiet activity. Keep the lights down. Wait until you feel sleepy and go back to bed. Keep regular sleeping and waking hours. Avoid naps. Exercise regularly. Avoid distractions at bedtime. Distractions include watching television or engaging in any intense or detailed activity like attempting to balance the household checkbook. Develop a bedtime ritual. Keep a familiar routine of bathing, brushing your teeth,   climbing into bed at the same time each night, listening to soothing music. Routines increase the success of falling to sleep faster. Use  relaxation techniques. This can be using breathing and muscle tension release routines. It can also include visualizing peaceful scenes. You can also help control troubling or intruding thoughts by keeping your mind occupied with boring or repetitive thoughts like the old concept of counting sheep. You can make it more creative like imagining planting one beautiful flower after another in your backyard garden. During your day, work to eliminate stress. When this is not possible use some of the previous suggestions to help reduce the anxiety that accompanies stressful situations. MAKE SURE YOU:  Understand these instructions. Will watch your condition. Will get help right away if you are not doing well or get worse. Document Released: 12/13/2000 Document Revised: 03/09/2012 Document Reviewed: 01/13/2008 ExitCare Patient Information 2015 [employer]. This information is not intended to replace advice given to you by your health care provider. Make sure you discuss any questions you have with your health care provider.  

## 2019-10-22 NOTE — BH Specialist Note (Signed)
Integrated Behavioral Health Initial Visit  MRN: [id number] Name: Jeff Vega  Number of [facility] Clinician visits:: 1/6 Session Start time: 8:45 AM  Session End time: 9:15 AM Total time: 30  Type of Service: [place] Interpretor:No. Interpretor Name and Language: NA   Warm Hand Off Completed.       SUBJECTIVE: Jeff Vega is a 23 y.o. male accompanied by self Patient was referred by FNP Edwards for depression and anxiety. Patient reports the following symptoms/concerns: Pt reports increase in depression and anxiety symptoms, which includes, difficulty maintaining concentration, withdrawn behavior, racing thoughts, decreased motivation, irritability, insomnia, and mild panic attacks Duration of problem: Onset of symptoms began at age 32 after the loss of mother; Severity of problem: severe  OBJECTIVE: Mood: Anxious and Affect: Depressed Risk of harm to self or others: No plan to harm self or others  LIFE CONTEXT: Family and Social: Pt receives support from family and friends School/Work: Pt is employed full time at [employer]. He is insured Self-Care: Pt is open to medication management and initiating psychotherapy Life Changes: Pt reports difficulty managing mental health conditions   GOALS ADDRESSED: Patient will: 1. Reduce symptoms of: agitation, anxiety and depression 2. Increase knowledge and/or ability of: coping skills  3. Demonstrate ability to: Increase healthy adjustment to current life circumstances and Increase adequate support systems for patient/family  INTERVENTIONS: Interventions utilized: Solution-Focused Strategies, Supportive Counseling and Psychoeducation and/or Health Education  Standardized Assessments completed: GAD-7 and PHQ 2&9  ASSESSMENT: Patient currently experiencing increase in depression and anxiety symptoms, which includes, difficulty maintaining concentration, withdrawn behavior, racing  thoughts, decreased motivation, irritability, insomnia, and mild panic attacks. Denies SI/HI.   Patient may benefit from psychotherapy and medication management. LCSW discussed correlation between one's physical and mental health. Treatment options were discussed. Pt is open to medication management through PCP and will schedule follow up appt with LCSW.   PLAN: 1. Follow up with behavioral health clinician on : Pt will schedule follow up appt 2. Behavioral recommendations: Comply with medication management and utilize strategies discussed 3. Referral(s): [facility] (In Clinic) 4. "From scale of 1-10, how likely are you to follow plan?":   Rebekah Chesterfield, LCSW 10/22/2019 4:09 PM

## 2019-10-29 ENCOUNTER — Ambulatory Visit: Payer: BC Managed Care – PPO | Admitting: Family Medicine

## 2019-11-15 ENCOUNTER — Other Ambulatory Visit: Payer: Self-pay

## 2019-11-15 DIAGNOSIS — Z20822 Contact with and (suspected) exposure to covid-19: Secondary | ICD-10-CM

## 2019-11-17 ENCOUNTER — Telehealth (INDEPENDENT_AMBULATORY_CARE_PROVIDER_SITE_OTHER): Payer: Self-pay | Admitting: Primary Care

## 2019-11-17 LAB — NOVEL CORONAVIRUS, NAA: SARS-CoV-2, NAA: NOT DETECTED

## 2019-11-17 NOTE — Telephone Encounter (Signed)
Patient called saying he got his COVID-19 test results and they were negative.Patient states he has not been feeling good and his employer said if he need to isolate himself for 2 weeks he needed a letter from his PCP. Please advise.

## 2019-11-21 ENCOUNTER — Telehealth: Payer: BC Managed Care – PPO

## 2019-11-21 ENCOUNTER — Encounter (HOSPITAL_COMMUNITY): Payer: Self-pay

## 2019-11-21 ENCOUNTER — Ambulatory Visit (HOSPITAL_COMMUNITY)
Admission: EM | Admit: 2019-11-21 | Discharge: 2019-11-21 | Disposition: A | Discharge status: Home / Self Care | Payer: BC Managed Care – PPO | Attending: Family Medicine | Admitting: Family Medicine

## 2019-11-21 ENCOUNTER — Other Ambulatory Visit: Payer: Self-pay

## 2019-11-21 DIAGNOSIS — Z833 Family history of diabetes mellitus: Secondary | ICD-10-CM | POA: Insufficient documentation

## 2019-11-21 DIAGNOSIS — R05 Cough: Secondary | ICD-10-CM | POA: Insufficient documentation

## 2019-11-21 DIAGNOSIS — R0602 Shortness of breath: Secondary | ICD-10-CM | POA: Insufficient documentation

## 2019-11-21 DIAGNOSIS — Z79899 Other long term (current) drug therapy: Secondary | ICD-10-CM | POA: Diagnosis not present

## 2019-11-21 DIAGNOSIS — Z20828 Contact with and (suspected) exposure to other viral communicable diseases: Secondary | ICD-10-CM | POA: Insufficient documentation

## 2019-11-21 DIAGNOSIS — R52 Pain, unspecified: Secondary | ICD-10-CM | POA: Diagnosis present

## 2019-11-21 DIAGNOSIS — R6883 Chills (without fever): Secondary | ICD-10-CM

## 2019-11-21 DIAGNOSIS — R61 Generalized hyperhidrosis: Secondary | ICD-10-CM | POA: Diagnosis not present

## 2019-11-21 DIAGNOSIS — R5383 Other fatigue: Secondary | ICD-10-CM | POA: Insufficient documentation

## 2019-11-21 DIAGNOSIS — R0981 Nasal congestion: Secondary | ICD-10-CM

## 2019-11-21 DIAGNOSIS — R059 Cough, unspecified: Secondary | ICD-10-CM

## 2019-11-21 NOTE — ED Triage Notes (Signed)
Patient presents to Urgent Care with complaints of sob, cough, nasal congestion, and chills since 6 days ago. Patient reports he was tested the day prior to symptom onset and his covid test was negative. Pt states he has passed symptoms on to his brother.

## 2019-11-21 NOTE — ED Provider Notes (Signed)
[facility]    CSN: [id number] Arrival date & time: 11/21/19  1114      History   Chief Complaint Chief Complaint  Patient presents with  . Shortness of Breath  . Cough    HPI Jeff Vega is a 23 y.o. male.   23 year old male presents with nasal congestion, cough, night sweats, fatigue and body aches for the past 5 to 6 days. Also having some shortness of breath the past 2 days. Denies any GI symptoms. No distinct fever but having chills. Has taken Mucinex and Advil with some relief. Was tested for COVID 19 almost 1 week ago when he just started having symptoms and was negative. No known exposure. His brother started experiencing similar symptoms 3 days ago. No other chronic health issues. Takes Lexapro daily.   The history is provided by the patient.    History reviewed. No pertinent past medical history.  There are no active problems to display for this patient.   Past Surgical History:  Procedure Laterality Date  . ADENOIDECTOMY    . TONSILLECTOMY         Home Medications    Prior to Admission medications   Medication Sig Start Date End Date Taking? Authorizing Provider  escitalopram (LEXAPRO) 10 MG tablet Take 1 tablet (10 mg total) by mouth daily. 10/21/19  Yes Kerin Perna, NP    Family History Family History  Problem Relation Age of Onset  . Diabetes Father   . Healthy Mother     Social History Social History   Tobacco Use  . Smoking status: Never Smoker  . Smokeless tobacco: Never Used  Substance Use Topics  . Alcohol use: No  . Drug use: No     Allergies   Patient has no known allergies.   Review of Systems Review of Systems  Constitutional: Positive for appetite change, chills and fatigue. Negative for activity change and fever.  HENT: Positive for congestion, postnasal drip and sore throat (irritated/scratchy). Negative for ear discharge, ear pain, facial swelling, mouth sores, nosebleeds, rhinorrhea, sinus  pressure, sinus pain, sneezing and trouble swallowing.   Eyes: Negative for photophobia, pain, discharge, redness, itching and visual disturbance.  Respiratory: Positive for cough and shortness of breath. Negative for chest tightness and wheezing.   Cardiovascular: Negative for chest pain and palpitations.  Gastrointestinal: Negative for abdominal pain, diarrhea, nausea and vomiting.  Musculoskeletal: Positive for arthralgias and myalgias. Negative for neck pain and neck stiffness.  Skin: Negative for color change, rash and wound.  Allergic/Immunologic: Negative for environmental allergies, food allergies and immunocompromised state.  Neurological: Positive for headaches. Negative for dizziness, tremors, seizures, syncope, weakness, light-headedness and numbness.  Hematological: Negative for adenopathy. Does not bruise/bleed easily.     Physical Exam Triage Vital Signs ED Triage Vitals  Enc Vitals Group     BP 11/21/19 1149 139/88     Pulse Rate 11/21/19 1149 70     Resp 11/21/19 1149 16     Temp 11/21/19 1149 98.2 F (36.8 C)     Temp Source 11/21/19 1149 Oral     SpO2 11/21/19 1149 100 %     Weight --      Height --      Head Circumference --      Peak Flow --      Pain Score 11/21/19 1153 0     Pain Loc --      Pain Edu? --      Excl. in  GC? --    No data found.  Updated Vital Signs BP 139/88 (BP Location: Left Arm)   Pulse 70   Temp 98.2 F (36.8 C) (Oral)   Resp 16   SpO2 100%   Visual Acuity Right Eye Distance:   Left Eye Distance:   Bilateral Distance:    Right Eye Near:   Left Eye Near:    Bilateral Near:     Physical Exam Vitals signs and nursing note reviewed.  Constitutional:      General: He is awake. He is not in acute distress.    Appearance: He is well-developed and well-groomed.     Comments: Patient sitting comfortably on exam table in no acute distress.   HENT:     Head: Normocephalic and atraumatic.     Right Ear: Hearing, tympanic  membrane, ear canal and external ear normal.     Left Ear: Hearing, tympanic membrane, ear canal and external ear normal.     Nose: Congestion present.     Right Turbinates: Swollen.     Left Turbinates: Swollen.     Right Sinus: No maxillary sinus tenderness or frontal sinus tenderness.     Left Sinus: No maxillary sinus tenderness or frontal sinus tenderness.     Mouth/Throat:     Lips: Pink.     Mouth: Mucous membranes are moist.     Pharynx: Uvula midline. Posterior oropharyngeal erythema present. No pharyngeal swelling, oropharyngeal exudate or uvula swelling.  Eyes:     Extraocular Movements: Extraocular movements intact.     Conjunctiva/sclera: Conjunctivae normal.  Neck:     Musculoskeletal: Normal range of motion and neck supple. No neck rigidity or muscular tenderness.  Cardiovascular:     Rate and Rhythm: Normal rate and regular rhythm.     Heart sounds: Normal heart sounds. No murmur.  Pulmonary:     Effort: Pulmonary effort is normal. No tachypnea or respiratory distress.     Breath sounds: Normal breath sounds and air entry. No decreased air movement. No decreased breath sounds, wheezing, rhonchi or rales.  Musculoskeletal: Normal range of motion.  Lymphadenopathy:     Cervical: No cervical adenopathy.  Skin:    General: Skin is warm and dry.     Capillary Refill: Capillary refill takes less than 2 seconds.     Findings: No rash.  Neurological:     General: No focal deficit present.     Mental Status: He is alert and oriented to person, place, and time.  Psychiatric:        Mood and Affect: Mood normal.        Behavior: Behavior normal. Behavior is cooperative.        Thought Content: Thought content normal.        Judgment: Judgment normal.      UC Treatments / Results  Labs (all labs ordered are listed, but only abnormal results are displayed) Labs Reviewed  NOVEL CORONAVIRUS, NAA (HOSPITAL ORDER, SEND-OUT TO REF LAB)    EKG   Radiology No results  found.  Procedures Procedures (including critical care time)  Medications Ordered in UC Medications - No data to display  Initial Impression / Assessment and Plan / UC Course  I have reviewed the triage vital signs and the nursing notes.  Pertinent labs & imaging results that were available during my care of the patient were reviewed by me and considered in my medical decision making (see chart for details).    Reviewed with patient  that he may have a viral URI. Patient is stable with no concerning respiratory symptoms (pulse Ox is 100%, RR = 16 and negative pulmonary exam). Recommend continue Mucinex as directed. May trial Tylenol 1000mg  every 8 hours as needed for body aches and chills. May alternate with Advil 600mg  as needed. Rest. Stay at home. Note written for work. If cough, shortness of breath, chills worsens or any chest pain develops, follow-up for recheck. Otherwise follow-up pending COVID 19 test results.  Final Clinical Impressions(s) / UC Diagnoses   Final diagnoses:  Nasal congestion  Body aches  Chills without fever  Cough     Discharge Instructions     Recommend take Tylenol 1000mg  every 8 hours as needed for body aches and chills. May alternate with Advil as needed. Continue Mucinex as directed for cough. Rest. Stay at home. If cough, shortness of breath, chest pain and chills gets worse, follow-up for recheck. Otherwise, follow-up pending COVID 19 test results.     ED Prescriptions    None     PDMP not reviewed this encounter.   , NP 11/21/19 2017

## 2019-11-21 NOTE — Discharge Instructions (Addendum)
Recommend take Tylenol 1000mg  every 8 hours as needed for body aches and chills. May alternate with Advil as needed. Continue Mucinex as directed for cough. Rest. Stay at home. If cough, shortness of breath, chest pain and chills gets worse, follow-up for recheck. Otherwise, follow-up pending COVID 19 test results.

## 2019-11-22 LAB — NOVEL CORONAVIRUS, NAA (HOSP ORDER, SEND-OUT TO REF LAB; TAT 18-24 HRS): SARS-CoV-2, NAA: NOT DETECTED

## 2019-11-22 NOTE — Telephone Encounter (Signed)
Patient is aware. He was recently seen at urgent care and they provided him a letter. Informed patient that once his results come back and should he still be having symptoms call [facility] and schedule. Nat Christen, CMA

## 2019-12-15 ENCOUNTER — Telehealth (INDEPENDENT_AMBULATORY_CARE_PROVIDER_SITE_OTHER): Payer: BC Managed Care – PPO | Admitting: Primary Care

## 2019-12-15 ENCOUNTER — Ambulatory Visit: Payer: BC Managed Care – PPO | Admitting: Licensed Clinical Social Worker

## 2019-12-21 ENCOUNTER — Other Ambulatory Visit: Payer: BC Managed Care – PPO

## 2019-12-22 ENCOUNTER — Telehealth (INDEPENDENT_AMBULATORY_CARE_PROVIDER_SITE_OTHER): Payer: BC Managed Care – PPO | Admitting: Primary Care

## 2019-12-26 ENCOUNTER — Other Ambulatory Visit: Payer: Self-pay

## 2019-12-26 ENCOUNTER — Ambulatory Visit (HOSPITAL_COMMUNITY)
Admission: EM | Admit: 2019-12-26 | Discharge: 2019-12-26 | Disposition: A | Discharge status: Home / Self Care | Payer: BC Managed Care – PPO | Attending: Family Medicine | Admitting: Family Medicine

## 2019-12-26 ENCOUNTER — Other Ambulatory Visit: Payer: Self-pay | Admitting: Family Medicine

## 2019-12-26 ENCOUNTER — Encounter (HOSPITAL_COMMUNITY): Payer: Self-pay

## 2019-12-26 DIAGNOSIS — R109 Unspecified abdominal pain: Secondary | ICD-10-CM

## 2019-12-26 DIAGNOSIS — R0981 Nasal congestion: Secondary | ICD-10-CM

## 2019-12-26 DIAGNOSIS — J3489 Other specified disorders of nose and nasal sinuses: Secondary | ICD-10-CM | POA: Diagnosis not present

## 2019-12-26 DIAGNOSIS — Z20828 Contact with and (suspected) exposure to other viral communicable diseases: Secondary | ICD-10-CM

## 2019-12-26 DIAGNOSIS — R110 Nausea: Secondary | ICD-10-CM

## 2019-12-26 LAB — POC SARS CORONAVIRUS 2 AG -  ED: SARS Coronavirus 2 Ag: NEGATIVE

## 2019-12-26 LAB — POC SARS CORONAVIRUS 2 AG: SARS Coronavirus 2 Ag: NEGATIVE

## 2019-12-26 MED ORDER — DICYCLOMINE HCL 10 MG PO CAPS: ORAL_CAPSULE | ORAL | 0 refills | Status: DC

## 2019-12-26 MED ORDER — ONDANSETRON HCL 4 MG PO TABS: 4.0000 mg | ORAL_TABLET | Freq: Three times a day (TID) | ORAL | 0 refills | Status: DC | PRN

## 2019-12-26 NOTE — ED Triage Notes (Signed)
Pt presents to th eUC with abdominal pain and nausea x 3 days; nasal congestion started today.

## 2019-12-26 NOTE — Discharge Instructions (Addendum)
Your Covid test is negative.  Keep pushing fluids.  Find which types of food agree most with your stomach.  Fevers, inability to keep down fluids, worsening s/s's should prompt seeing medical attention again.

## 2019-12-26 NOTE — ED Provider Notes (Signed)
  [facility]    CSN: [id number] Arrival date & time: 12/26/19  1035  Chief Complaint  Patient presents with  . Abdominal Pain  . Nausea  . Nasal Congestion    Jeff Vega here for URI complaints.  Duration: 3 days  Associated symptoms: sinus congestion, rhinorrhea and nausea w/o vomiting, abd cramping Denies: sinus pain, itchy watery eyes, ear pain, ear drainage, sore throat, wheezing, shortness of breath, myalgia, diarrhea, and fevers Treatment to date: Pepto Bismol w/o relief Sick contacts: No  ROS:  Const: Denies fevers HEENT: As noted in HPI Lungs: No SOB  History reviewed. No pertinent past medical history.  BP (!) 141/71 (BP Location: Left Arm)   Pulse (!) 105   Temp 97.9 F (36.6 C) (Oral)   Resp 16   SpO2 98%  General: Awake, alert, appears stated age HEENT: AT, [place], ears patent b/l and TM's neg, nares patent w/o discharge, pharynx pink and without exudates, MMM Neck: No masses or asymmetry Heart: RRR Lungs: CTAB, no accessory muscle use Abd: BS+, S, mild umb ttp, no masses or organomegaly Psych: Age appropriate judgment and insight, normal mood and affect   Final Clinical Impressions(s) / UC Diagnoses   Final diagnoses:  Nausea  Abdominal cramping     Discharge Instructions     Your Covid test is negative.  Keep pushing fluids.  Find which types of food agree most with your stomach.  Fevers, inability to keep down fluids, worsening s/s's should prompt seeing medical attention again.     ED Prescriptions    Medication Sig Dispense Auth. Provider   ondansetron (ZOFRAN) 4 MG tablet Take 1 tablet (4 mg total) by mouth every 8 (eight) hours as needed for nausea or vomiting. 20 tablet Shelda Pal, DO   dicyclomine (BENTYL) 10 MG capsule Take 1 tab every 6 hours as needed for abdominal cramping. 60 capsule Shelda Pal, DO        [name] [place], [place] 12/26/19 1207

## 2020-01-05 ENCOUNTER — Other Ambulatory Visit: Payer: Self-pay

## 2020-01-05 ENCOUNTER — Emergency Department (HOSPITAL_COMMUNITY)
Admission: EM | Admit: 2020-01-05 | Discharge: 2020-01-05 | Discharge status: Against Medical Advice | Payer: BC Managed Care – PPO
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
## 2020-01-05 NOTE — ED Notes (Addendum)
Pt immediately became upset with staff and screaming due to visitation policies. Multiple staff explained policy and pt in staff faces screaming more "fuck you. You are stupid bitch. fuck this place". Pt screamed he wants to leave and walked out. Rn unable to finish triage. Ambulatory.

## 2020-01-11 ENCOUNTER — Ambulatory Visit (HOSPITAL_COMMUNITY)
Admission: EM | Admit: 2020-01-11 | Discharge: 2020-01-11 | Disposition: A | Discharge status: Home / Self Care | Payer: BC Managed Care – PPO

## 2020-01-11 ENCOUNTER — Other Ambulatory Visit: Payer: Self-pay

## 2020-01-11 NOTE — ED Notes (Signed)
Called pt name; no answer. Pt Jeff Vega

## 2020-01-11 NOTE — ED Notes (Signed)
Called pt no answer °

## 2020-01-11 NOTE — ED Notes (Signed)
Called pt name no answer

## 2020-01-11 NOTE — ED Provider Notes (Signed)
Patient LWBS.   Mardella Layman, MD 01/15/20 [phone]

## 2020-01-25 ENCOUNTER — Ambulatory Visit (HOSPITAL_COMMUNITY)
Admission: EM | Admit: 2020-01-25 | Discharge: 2020-01-25 | Disposition: A | Discharge status: Home / Self Care | Payer: BC Managed Care – PPO | Attending: Family Medicine | Admitting: Family Medicine

## 2020-01-25 ENCOUNTER — Encounter (HOSPITAL_COMMUNITY): Payer: Self-pay | Admitting: Emergency Medicine

## 2020-01-25 ENCOUNTER — Other Ambulatory Visit: Payer: Self-pay

## 2020-01-25 ENCOUNTER — Ambulatory Visit (INDEPENDENT_AMBULATORY_CARE_PROVIDER_SITE_OTHER): Payer: BC Managed Care – PPO

## 2020-01-25 DIAGNOSIS — M109 Gout, unspecified: Secondary | ICD-10-CM | POA: Diagnosis not present

## 2020-01-25 DIAGNOSIS — M79672 Pain in left foot: Secondary | ICD-10-CM

## 2020-01-25 DIAGNOSIS — R030 Elevated blood-pressure reading, without diagnosis of hypertension: Secondary | ICD-10-CM | POA: Diagnosis not present

## 2020-01-25 MED ORDER — COLCHICINE 0.6 MG PO TABS: ORAL_TABLET | ORAL | 0 refills | Status: DC

## 2020-01-25 MED ORDER — INDOMETHACIN 50 MG PO CAPS: 50.0000 mg | ORAL_CAPSULE | Freq: Three times a day (TID) | ORAL | 0 refills | Status: DC
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
## 2020-01-25 NOTE — Discharge Instructions (Addendum)
Your blood pressure was noted to be elevated during your visit today. You may return here within the next few days to recheck if unable to see your primary care doctor. If your blood pressure remains persistently elevated, you may need to begin taking a medication.  BP (!) 145/85 (BP Location: Right Arm)    Pulse 77    Temp 98.2 F (36.8 C) (Oral)    Resp 18    SpO2 100%
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
## 2020-02-28 ENCOUNTER — Ambulatory Visit: Payer: BC Managed Care – PPO | Attending: Internal Medicine

## 2020-02-28 DIAGNOSIS — Z20822 Contact with and (suspected) exposure to covid-19: Secondary | ICD-10-CM

## 2020-03-01 LAB — NOVEL CORONAVIRUS, NAA: SARS-CoV-2, NAA: NOT DETECTED

## 2020-03-20 ENCOUNTER — Ambulatory Visit (INDEPENDENT_AMBULATORY_CARE_PROVIDER_SITE_OTHER)
Admission: RE | Admit: 2020-03-20 | Discharge: 2020-03-20 | Disposition: A | Discharge status: Home / Self Care | Payer: BC Managed Care – PPO | Source: Ambulatory Visit

## 2020-03-20 DIAGNOSIS — R112 Nausea with vomiting, unspecified: Secondary | ICD-10-CM

## 2020-03-20 MED ORDER — ONDANSETRON HCL 4 MG PO TABS: 4.0000 mg | ORAL_TABLET | Freq: Four times a day (QID) | ORAL | 0 refills | Status: DC | PRN

## 2020-03-20 NOTE — Discharge Instructions (Signed)
Take the Zofran as directed for nausea and vomiting.    Follow up with your primary care provider if your symptoms are not improving.

## 2020-03-20 NOTE — ED Provider Notes (Signed)
Virtual Visit via Video Note:  Jeff Vega  initiated request for Telemedicine visit with [facility] Urgent Care team. I connected with Jeff Vega  on 03/20/2020 at 10:22 AM  for a synchronized telemedicine visit using a video enabled HIPPA compliant telemedicine application. I verified that I am speaking with Jeff Vega  using two identifiers. Jeff Bail, NP  was physically located in a [facility] Urgent care site and Jeff Vega was located at a different location.   The limitations of evaluation and management by telemedicine as well as the availability of in-person appointments were discussed. Patient was informed that he  may incur a bill ( including co-pay) for this virtual visit encounter. Jeff Vega  expressed understanding and gave verbal consent to proceed with virtual visit.     History of Present Illness:Jeff Vega  is a 24 y.o. male presents for evaluation of nausea x 2 days.  One episode of emesis today.  Denies fever, chills, sore throat, cough, shortness of breath, abdominal pain, diarrhea, rash, or other symptoms.  He states he needs a refill on Zofran.  He also needs a note for missing work today.     No Known Allergies   History reviewed. No pertinent past medical history.   Social History   Tobacco Use  . Smoking status: Never Smoker  . Smokeless tobacco: Never Used  Substance Use Topics  . Alcohol use: No  . Drug use: No    ROS: as stated in HPI.  All other systems reviewed and negative.      Observations/Objective: Physical Exam  VITALS: Patient denies fever. GENERAL: Alert, appears well and in no acute distress. HEENT: Atraumatic. NECK: Normal movements of the head and neck. CARDIOPULMONARY: No increased WOB. Speaking in clear sentences. I:E ratio WNL.  MS: Moves all visible extremities without noticeable abnormality. PSYCH: Pleasant and cooperative, well-groomed. Speech normal rate and rhythm. Affect is appropriate. Insight and judgement are  appropriate. Attention is focused, linear, and appropriate.  NEURO: CN grossly intact. Oriented as arrived to appointment on time with no prompting. Moves both UE equally.  SKIN: No obvious lesions, wounds, erythema, or cyanosis noted on face or hands.   Assessment and Plan:    ICD-10-CM   1. Non-intractable vomiting with nausea, unspecified vomiting type  R11.2        Follow Up Instructions: Treating n/v with Zofran.  Work note for today provided.  Instructed patient to follow-up with his PCP if his symptoms or not improving.  Patient agrees to plan of care.      I discussed the assessment and treatment plan with the patient. The patient was provided an opportunity to ask questions and all were answered. The patient agreed with the plan and demonstrated an understanding of the instructions.   The patient was advised to call back or seek an in-person evaluation if the symptoms worsen or if the condition fails to improve as anticipated.      Jeff Bail, NP  03/20/2020 10:22 AM         Jeff Bail, NP 03/20/20 1024

## 2020-03-23 ENCOUNTER — Ambulatory Visit (INDEPENDENT_AMBULATORY_CARE_PROVIDER_SITE_OTHER): Payer: BC Managed Care – PPO | Admitting: Primary Care

## 2020-05-13 ENCOUNTER — Telehealth: Payer: BC Managed Care – PPO

## 2020-05-13 ENCOUNTER — Ambulatory Visit (INDEPENDENT_AMBULATORY_CARE_PROVIDER_SITE_OTHER)
Admission: RE | Admit: 2020-05-13 | Discharge: 2020-05-13 | Disposition: A | Discharge status: Home / Self Care | Payer: BLUE CROSS/BLUE SHIELD | Source: Ambulatory Visit

## 2020-05-13 DIAGNOSIS — R197 Diarrhea, unspecified: Secondary | ICD-10-CM

## 2020-05-13 DIAGNOSIS — R112 Nausea with vomiting, unspecified: Secondary | ICD-10-CM

## 2020-05-13 MED ORDER — ONDANSETRON 4 MG PO TBDP: 4.0000 mg | ORAL_TABLET | Freq: Three times a day (TID) | ORAL | 0 refills | Status: DC | PRN

## 2020-05-13 NOTE — ED Provider Notes (Signed)
Virtual Visit via Video Note:  Jeff Vega  initiated request for Telemedicine visit with [facility] Urgent Care team. I connected with Jeff Vega  on 05/13/2020 at 10:03 AM  for a synchronized telemedicine visit using a video enabled HIPPA compliant telemedicine application. I verified that I am speaking with Jeff Vega  using two identifiers. Jeff Vega C Jeff Doner, PA-C  was physically located in a [facility] Urgent care site and Jeff Vega was located at a different location.   The limitations of evaluation and management by telemedicine as well as the availability of in-person appointments were discussed. Patient was informed that he  may incur a bill ( including co-pay) for this virtual visit encounter. Jeff Vega  expressed understanding and gave verbal consent to proceed with virtual visit.     History of Present Illness:Jeff Vega  is a 24 y.o. male presents for evaluation of nausea vomiting and diarrhea.  Symptoms began today with waking up.  Has had one episode of vomiting.  Stools have been frequent and loose.  Denies blood in stool or vomit.  He has been able to tolerate fluids, but not solids.  He has some mild abdominal pain which he describes as cramping, but states that this is minimal.  Denies associated URI symptoms.  Denies fevers.    No Known Allergies   No past medical history on file.   Social History   Tobacco Use  . Smoking status: Never Smoker  . Smokeless tobacco: Never Used  Substance Use Topics  . Alcohol use: No  . Drug use: No        Observations/Objective: Physical Exam  Constitutional: He is oriented to person, place, and time and well-developed, well-nourished, and in no distress. No distress.  HENT:  Head: Normocephalic and atraumatic.  Pulmonary/Chest: Effort normal. No respiratory distress.  Speaking in full sentences  Abdominal:  Denies tenderness with palpation of his abdomen  Musculoskeletal:     Cervical back: Normal range of  motion.  Neurological: He is alert and oriented to person, place, and time.  Speech clear, face symmetric     Assessment and Plan:  Nausea vomiting and diarrhea  Acute onset of nausea vomiting and diarrhea with minimal abdominal pain most likely viral gastroenteritis.  Recommending symptomatic and supportive care with close monitoring.  Zofran for nausea rest and push fluids.  Follow-up in person if any symptoms persisting or worsening.  Discussed strict return precautions. Patient verbalized understanding and is agreeable with plan.     Follow Up Instructions:     I discussed the assessment and treatment plan with the patient. The patient was provided an opportunity to ask questions and all were answered. The patient agreed with the plan and demonstrated an understanding of the instructions.   The patient was advised to call back or seek an in-person evaluation if the symptoms worsen or if the condition fails to improve as anticipated.      Lew Dawes, PA-C  05/13/2020 10:03 AM         Lew Dawes, PA-C 05/13/20 1011

## 2020-05-13 NOTE — Discharge Instructions (Signed)
Your nausea, vomiting, and diarrhea appear to have a viral cause. Should gradually improve over the next few days  For nausea: Zofran prescribed. Begin with every 6 hours, than as you are able to hold food down, take it as needed. Start with clear liquids, then move to plain foods like bananas, rice, applesauce, toast, broth, grits, oatmeal. As those food settle okay you may transition to your normal foods. Avoid spicy and greasy foods as much as possible.  For Diarrhea: This is your body's natural way of getting rid of a virus. You may try taking 1 imodium to decrease amount of stools a day, but we do not want you to stop your diarrhea.   Preventing dehydration is key! You need to replace the fluid your body is expelling. Drink plenty of fluids, may use Pedialyte or sports drinks.   Please return if you are experiencing blood in your vomit or stool or experiencing dizziness, lightheadedness, extreme fatigue, increased abdominal pain.

## 2020-08-01 ENCOUNTER — Ambulatory Visit (INDEPENDENT_AMBULATORY_CARE_PROVIDER_SITE_OTHER)
Admission: RE | Admit: 2020-08-01 | Discharge: 2020-08-01 | Disposition: A | Discharge status: Home / Self Care | Payer: BLUE CROSS/BLUE SHIELD | Source: Ambulatory Visit

## 2020-08-01 ENCOUNTER — Other Ambulatory Visit: Payer: Self-pay

## 2020-08-01 DIAGNOSIS — R1030 Lower abdominal pain, unspecified: Secondary | ICD-10-CM | POA: Diagnosis not present

## 2020-08-01 DIAGNOSIS — K529 Noninfective gastroenteritis and colitis, unspecified: Secondary | ICD-10-CM

## 2020-08-01 MED ORDER — ONDANSETRON HCL 4 MG PO TABS: 4.0000 mg | ORAL_TABLET | Freq: Four times a day (QID) | ORAL | 0 refills | Status: DC | PRN

## 2020-08-01 NOTE — ED Provider Notes (Signed)
Virtual Visit via Video Note:  Jeff Vega  initiated request for Telemedicine visit with [facility] Urgent Care team. I connected with [name]  on 08/01/2020 at 1:07 PM  for a synchronized telemedicine visit using a video enabled HIPPA compliant telemedicine application. I verified that I am speaking with [name]  using two identifiers. Mickie Bail, NP  was physically located in a [facility] Urgent care site and Lynda Capistran was located at a different location.   The limitations of evaluation and management by telemedicine as well as the availability of in-person appointments were discussed. Patient was informed that he  may incur a bill ( including co-pay) for this virtual visit encounter. Lenon Kuennen  expressed understanding and gave verbal consent to proceed with virtual visit.     History of Present Illness:Jeff Vega  is a 24 y.o. male presents for evaluation of abdominal discomfort, nausea, vomiting, or diarrhea x 2-3 days.  He rates his abdominal pain at 6-7/10; located in his middle lower abdomen.   He has had previous episodes of similar symptoms but has not seen his PCP.  He denies fever but has had chills.  Temperature 98.6 at home currently.  He denies sore throat, cough, shortness of breath, rash, or other symptoms.  No treatment attempted at home.     No Known Allergies   History reviewed. No pertinent past medical history.   Social History   Tobacco Use  . Smoking status: Never Smoker  . Smokeless tobacco: Never Used  Vaping Use  . Vaping Use: Never used  Substance Use Topics  . Alcohol use: No  . Drug use: No    ROS: as stated in HPI.  All other systems reviewed and negative.      Observations/Objective: Physical Exam  VITALS: Patient denies fever. GENERAL: Alert, appears well and in no acute distress. HEENT: Atraumatic. Oral mucosa appears moist. NECK: Normal movements of the head and neck. CARDIOPULMONARY: No increased WOB. Speaking in clear  sentences. I:E ratio WNL.  MS: Moves all visible extremities without noticeable abnormality. PSYCH: Pleasant and cooperative, well-groomed. Speech normal rate and rhythm. Affect is appropriate. Insight and judgement are appropriate. Attention is focused, linear, and appropriate.  NEURO: CN grossly intact. Oriented as arrived to appointment on time with no prompting. Moves both UE equally.  SKIN: No obvious lesions, wounds, erythema, or cyanosis noted on face or hands.   Assessment and Plan:    ICD-10-CM   1. Gastroenteritis  K52.9   2. Lower abdominal pain  R10.30        Follow Up Instructions: Discussed with patient that he may need to be seen in person if his symptoms are not improving or get worse.  Patient declines in person evaluation at this time.  Treating his nausea and vomiting with Zofran.  Work note provided for today.  Patient agrees to plan of care.    I discussed the assessment and treatment plan with the patient. The patient was provided an opportunity to ask questions and all were answered. The patient agreed with the plan and demonstrated an understanding of the instructions.   The patient was advised to call back or seek an in-person evaluation if the symptoms worsen or if the condition fails to improve as anticipated.      Mickie Bail, NP  08/01/2020 1:07 PM         Mickie Bail, NP 08/01/20 [phone]

## 2020-08-01 NOTE — Discharge Instructions (Signed)
Take the antinausea medication as directed.    Keep yourself hydrated with clear liquids, such as water, Gatorade, Pedialyte, Sprite, or ginger ale.    Go to the emergency department if you have acute worsening symptoms.     

## 2020-08-28 ENCOUNTER — Other Ambulatory Visit: Payer: Self-pay

## 2020-08-28 ENCOUNTER — Encounter (INDEPENDENT_AMBULATORY_CARE_PROVIDER_SITE_OTHER): Payer: Self-pay | Admitting: Family Medicine

## 2020-08-28 ENCOUNTER — Telehealth (INDEPENDENT_AMBULATORY_CARE_PROVIDER_SITE_OTHER): Payer: BLUE CROSS/BLUE SHIELD | Admitting: Family Medicine

## 2020-08-28 DIAGNOSIS — M79674 Pain in right toe(s): Secondary | ICD-10-CM | POA: Diagnosis not present

## 2020-08-28 MED ORDER — PREDNISONE 20 MG PO TABS: 20.0000 mg | ORAL_TABLET | Freq: Every day | ORAL | 0 refills | Status: DC

## 2020-08-28 NOTE — Progress Notes (Signed)
Pt was seen at UC diagnosed with Gout / took tylenol 500 mg for pain port the past week   Need a MD note for LOA

## 2020-08-28 NOTE — Progress Notes (Signed)
Virtual Visit via Video Note  I connected with Jeff Vega, on 08/28/2020 at 4:19 PM by video enabled telemedicine device due to the COVID-19 pandemic and verified that I am speaking with the correct person using two identifiers.   Consent: I discussed the limitations, risks, security and privacy concerns of performing an evaluation and management service by telemedicine and the availability of in person appointments. I also discussed with the patient that there may be a patient responsible charge related to this service. The patient expressed understanding and agreed to proceed.   Location of Patient: [place] of Provider: Clinic   Persons participating in Telemedicine visit: Jeff Vega Dr. [name]     History of Present Illness: Jeff Vega presents today for an acute visit complaining of 1.5 week history of R big toe pain which he attributes to Gout as he had a similar experience early on in the year and was diagnosed with Gout and placed on Indomethacin. No uric acid level is available in his chart. 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   No past medical history on file. No Known Allergies  Current Outpatient Medications on File Prior to Visit  Medication Sig Dispense Refill  . colchicine 0.6 MG tablet Take two tablets as one dose followed one hour later by one tablet. 3 tablet 0  . escitalopram (LEXAPRO) 10 MG tablet Take 1 tablet (10 mg total) by mouth daily. 30 tablet 2  . indomethacin (INDOCIN) 50 MG capsule Take 1 capsule (50 mg total) by mouth 3 (three) times daily with meals. 15 capsule 0  . ondansetron (ZOFRAN ODT) 4 MG disintegrating tablet Take 1 tablet (4 mg total) by mouth every 8 (eight) hours as needed for nausea or vomiting. 20 tablet 0  . ondansetron (ZOFRAN) 4 MG tablet Take 1 tablet (4 mg total) by mouth every 6 (six) hours as needed for nausea  or vomiting. 12 tablet 0  . [DISCONTINUED] dicyclomine (BENTYL) 10 MG capsule Take 1 tab every 6 hours as needed for abdominal cramping. 60 capsule 0   No current facility-administered medications on file prior to visit.    Observations/Objective: Alert, awake, oriented x3 Not in acute distress R big toe with slight edema no erythema.  Assessment and Plan: 1. Pain of toe of right foot Will need to exclude Gout - Uric Acid; Future - predniSONE (DELTASONE) 20 MG tablet; Take 1 tablet (20 mg total) by mouth daily with breakfast.  Dispense: 5 tablet; Refill: 0   Follow Up Instructions: Return if symptoms worsen or fail to improve.    I discussed the assessment and treatment plan with the patient. The patient was provided an opportunity to ask questions and all were answered. The patient agreed with the plan and demonstrated an understanding of the instructions.   The patient was advised to call back or seek an in-person evaluation if the symptoms worsen or if the condition fails to improve as anticipated.     I provided 12 minutes total of Telehealth time during this encounter including median intraservice time, reviewing previous notes, investigations, ordering medications, medical decision making, coordinating care and patient verbalized understanding at the end of the visit.     Hoy Register, MD, FAAFP. [facility] [place], [place] [phone]   08/28/2020, 4:19 PM

## 2020-08-29 ENCOUNTER — Other Ambulatory Visit (INDEPENDENT_AMBULATORY_CARE_PROVIDER_SITE_OTHER): Payer: BLUE CROSS/BLUE SHIELD

## 2020-08-29 ENCOUNTER — Encounter: Payer: Self-pay | Admitting: Family Medicine

## 2020-08-29 ENCOUNTER — Other Ambulatory Visit: Payer: Self-pay

## 2020-08-29 DIAGNOSIS — M79674 Pain in right toe(s): Secondary | ICD-10-CM

## 2020-08-30 ENCOUNTER — Other Ambulatory Visit: Payer: Self-pay | Admitting: Family Medicine

## 2020-08-30 DIAGNOSIS — M10071 Idiopathic gout, right ankle and foot: Secondary | ICD-10-CM

## 2020-08-30 LAB — URIC ACID: Uric Acid: 10 mg/dL — ABNORMAL HIGH (ref 3.8–8.4)

## 2020-08-30 MED ORDER — COLCHICINE 0.6 MG PO TABS: ORAL_TABLET | ORAL | 1 refills | Status: DC

## 2020-08-30 MED ORDER — ALLOPURINOL 300 MG PO TABS: 300.0000 mg | ORAL_TABLET | Freq: Every day | ORAL | 3 refills | Status: DC

## 2020-11-25 ENCOUNTER — Encounter (HOSPITAL_COMMUNITY): Payer: Self-pay

## 2020-11-25 ENCOUNTER — Other Ambulatory Visit: Payer: Self-pay

## 2020-11-25 ENCOUNTER — Ambulatory Visit (HOSPITAL_COMMUNITY)
Admission: EM | Admit: 2020-11-25 | Discharge: 2020-11-25 | Disposition: A | Discharge status: Home / Self Care | Payer: BLUE CROSS/BLUE SHIELD | Attending: Emergency Medicine | Admitting: Emergency Medicine

## 2020-11-25 DIAGNOSIS — M10071 Idiopathic gout, right ankle and foot: Secondary | ICD-10-CM | POA: Diagnosis not present

## 2020-11-25 MED ORDER — PREDNISONE 10 MG PO TABS: ORAL_TABLET | ORAL | 0 refills | Status: DC

## 2020-11-25 MED ORDER — KETOROLAC TROMETHAMINE 30 MG/ML IJ SOLN
INTRAMUSCULAR | Status: AC
  Filled 2020-11-25: qty 1

## 2020-11-25 MED ORDER — KETOROLAC TROMETHAMINE 30 MG/ML IJ SOLN
30.0000 mg | Freq: Once | INTRAMUSCULAR | Status: AC
  Administered 2020-11-25: 30 mg via INTRAMUSCULAR

## 2020-11-25 NOTE — ED Triage Notes (Signed)
Pt present right foot pain and swelling. Pt states he has a history of gout in his right foot. Symptoms started on Tuesday.

## 2020-11-25 NOTE — ED Provider Notes (Signed)
[facility]    CSN: [id number] Arrival date & time: 11/25/20  1213      History   Chief Complaint Chief Complaint  Patient presents with   Foot Pain    HPI Jeff Vega is a 24 y.o. male presenting today for evaluation of right foot pain and swelling.  Reports over the past 4 days he has had pain in his right foot.  Initially was in his ankle, but is moved to the base of his right great toe.  Feels similar to when he has had gout previously.  Denies any injury fall or trauma.  Denies numbness or tingling.  Has used Tylenol/ibuprofen without relief.  Denies currently taking all allopurinol which is previously been prescribed by PCP.    HPI  History reviewed. No pertinent past medical history.  There are no problems to display for this patient.   Past Surgical History:  Procedure Laterality Date   ADENOIDECTOMY     TONSILLECTOMY         Home Medications    Prior to Admission medications   Medication Sig Start Date End Date Taking? Authorizing Provider  allopurinol (ZYLOPRIM) 300 MG tablet Take 1 tablet (300 mg total) by mouth daily. 08/30/20   Hoy Register, MD  colchicine 0.6 MG tablet Take two tablets (1.3 mg) at the onset of a gout attack and may repeat 1 tab (0.6 mg) after 1 hour if symptoms persist. 08/30/20   Hoy Register, MD  escitalopram (LEXAPRO) 10 MG tablet Take 1 tablet (10 mg total) by mouth daily. 10/21/19   [name], NP  indomethacin (INDOCIN) 50 MG capsule Take 1 capsule (50 mg total) by mouth 3 (three) times daily with meals. 01/25/20   Mardella Layman, MD  ondansetron (ZOFRAN ODT) 4 MG disintegrating tablet Take 1 tablet (4 mg total) by mouth every 8 (eight) hours as needed for nausea or vomiting. 05/13/20   Magdalynn Davilla C, PA-C  ondansetron (ZOFRAN) 4 MG tablet Take 1 tablet (4 mg total) by mouth every 6 (six) hours as needed for nausea or vomiting. 08/01/20   Mickie Bail, NP  predniSONE (DELTASONE) 10 MG tablet Begin with 6  tabs on day 1, 5 tab on day 2, 4 tab on day 3, 3 tab on day 4, 2 tab on day 5, 1 tab on day 6-take with food 11/25/20   Grisel Blumenstock C, PA-C  dicyclomine (BENTYL) 10 MG capsule Take 1 tab every 6 hours as needed for abdominal cramping. 12/26/19 01/25/20  Sharlene Dory, DO    Family History Family History  Problem Relation Age of Onset   Diabetes Father    Healthy Mother     Social History Social History   Tobacco Use   Smoking status: Never Smoker   Smokeless tobacco: Never Used  Building services engineer Use: Never used  Substance Use Topics   Alcohol use: No   Drug use: No     Allergies   Patient has no known allergies.   Review of Systems Review of Systems  Constitutional: Negative for fatigue and fever.  Eyes: Negative for redness, itching and visual disturbance.  Respiratory: Negative for shortness of breath.   Cardiovascular: Negative for chest pain and leg swelling.  Gastrointestinal: Negative for nausea and vomiting.  Musculoskeletal: Positive for arthralgias and joint swelling. Negative for myalgias.  Skin: Negative for color change, rash and wound.  Neurological: Negative for dizziness, syncope, weakness, light-headedness and headaches.  Physical Exam Triage Vital Signs ED Triage Vitals  Enc Vitals Group     BP 11/25/20 1323 (!) 145/84     Pulse Rate 11/25/20 1323 86     Resp 11/25/20 1323 18     Temp 11/25/20 1323 98.9 F (37.2 C)     Temp Source 11/25/20 1323 Oral     SpO2 11/25/20 1323 100 %     Weight --      Height --      Head Circumference --      Peak Flow --      Pain Score 11/25/20 1322 9     Pain Loc --      Pain Edu? --      Excl. in GC? --    No data found.  Updated Vital Signs BP (!) 145/84 (BP Location: Right Arm)    Pulse 86    Temp 98.9 F (37.2 C) (Oral)    Resp 18    SpO2 100%   Visual Acuity Right Eye Distance:   Left Eye Distance:   Bilateral Distance:    Right Eye Near:   Left Eye Near:      Bilateral Near:     Physical Exam Vitals and nursing note reviewed.  Constitutional:      Appearance: He is well-developed.     Comments: No acute distress  HENT:     Head: Normocephalic and atraumatic.     Nose: Nose normal.  Eyes:     Conjunctiva/sclera: Conjunctivae normal.  Cardiovascular:     Rate and Rhythm: Normal rate.  Pulmonary:     Effort: Pulmonary effort is normal. No respiratory distress.  Abdominal:     General: There is no distension.  Musculoskeletal:        General: Normal range of motion.     Cervical back: Neck supple.     Comments: Right great MTP joint with erythema swelling and warmth, dorsalis pedis 2+  Skin:    General: Skin is warm and dry.  Neurological:     Mental Status: He is alert and oriented to person, place, and time.      UC Treatments / Results  Labs (all labs ordered are listed, but only abnormal results are displayed) Labs Reviewed - No data to display  EKG   Radiology No results found.  Procedures Procedures (including critical care time)  Medications Ordered in UC Medications  ketorolac (TORADOL) 30 MG/ML injection 30 mg (has no administration in time range)    Initial Impression / Assessment and Plan / UC Course  I have reviewed the triage vital signs and the nursing notes.  Pertinent labs & imaging results that were available during my care of the patient were reviewed by me and considered in my medical decision making (see chart for details).     Exam consistent with gout, history of similar, will provide Toradol prior to discharge and will continue on course of prednisone.  Provided education on low purine diet.  Follow-up with PCP for further prevention if recurrent.  Discussed strict return precautions. Patient verbalized understanding and is agreeable with plan.  Final Clinical Impressions(s) / UC Diagnoses   Final diagnoses:  Acute idiopathic gout involving toe of right foot     Discharge Instructions      We gave you a shot of Toradol Begin prednisone taper over the next 6 days-begin with 6 tablets / 60 mg on day 1, decrease by 1 tablet each day until complete-6, 5,  4, 3, 2, 1-take with food and in the morning if you are able Please monitor for resolution of pain and swelling, follow-up if not improving or worsening    ED Prescriptions    Medication Sig Dispense Auth. Provider   predniSONE (DELTASONE) 10 MG tablet Begin with 6 tabs on day 1, 5 tab on day 2, 4 tab on day 3, 3 tab on day 4, 2 tab on day 5, 1 tab on day 6-take with food 21 tablet Modine Oppenheimer C, PA-C     PDMP not reviewed this encounter.   Lew Dawes, [place] 11/25/20 1417

## 2020-11-25 NOTE — Discharge Instructions (Signed)
We gave you a shot of Toradol Begin prednisone taper over the next 6 days-begin with 6 tablets / 60 mg on day 1, decrease by 1 tablet each day until complete-6, 5, 4, 3, 2, 1-take with food and in the morning if you are able Please monitor for resolution of pain and swelling, follow-up if not improving or worsening

## 2020-11-26 ENCOUNTER — Other Ambulatory Visit (INDEPENDENT_AMBULATORY_CARE_PROVIDER_SITE_OTHER): Payer: Self-pay | Admitting: Primary Care

## 2020-11-26 NOTE — Telephone Encounter (Signed)
Requested Prescriptions  Pending Prescriptions Disp Refills   escitalopram (LEXAPRO) 10 MG tablet [Pharmacy Med Name: Escitalopram Oxalate 10 MG Oral Tablet] 30 tablet 0    Sig: Take 1 tablet by mouth once daily     Psychiatry:  Antidepressants - SSRI Passed - 11/26/2020 11:55 AM      Passed - Valid encounter within last 6 months    Recent Outpatient Visits          3 months ago Pain of toe of right foot   [facility] [name], [name], MD   1 year ago Adjustment disorder with mixed anxiety and depressed mood   [facility] [name], NP   1 year ago Encounter to establish care   [name] [name], NP      Future Appointments            In 1 week [name], NP [facility]

## 2020-11-30 ENCOUNTER — Ambulatory Visit (INDEPENDENT_AMBULATORY_CARE_PROVIDER_SITE_OTHER): Payer: BLUE CROSS/BLUE SHIELD | Admitting: Primary Care

## 2020-12-07 ENCOUNTER — Ambulatory Visit (INDEPENDENT_AMBULATORY_CARE_PROVIDER_SITE_OTHER): Payer: BLUE CROSS/BLUE SHIELD | Admitting: Primary Care

## 2020-12-07 ENCOUNTER — Encounter (INDEPENDENT_AMBULATORY_CARE_PROVIDER_SITE_OTHER): Payer: Self-pay | Admitting: Primary Care

## 2020-12-07 ENCOUNTER — Other Ambulatory Visit: Payer: Self-pay

## 2020-12-07 VITALS — BP 134/88 | HR 105 | Temp 97.3°F | Ht 72.0 in | Wt 247.6 lb

## 2020-12-07 DIAGNOSIS — M10071 Idiopathic gout, right ankle and foot: Secondary | ICD-10-CM

## 2020-12-07 DIAGNOSIS — Z760 Encounter for issue of repeat prescription: Secondary | ICD-10-CM | POA: Diagnosis not present

## 2020-12-07 DIAGNOSIS — Z09 Encounter for follow-up examination after completed treatment for conditions other than malignant neoplasm: Secondary | ICD-10-CM | POA: Diagnosis not present

## 2020-12-07 DIAGNOSIS — F4323 Adjustment disorder with mixed anxiety and depressed mood: Secondary | ICD-10-CM

## 2020-12-07 MED ORDER — COLCHICINE 0.6 MG PO TABS: ORAL_TABLET | ORAL | 1 refills | Status: DC

## 2020-12-07 MED ORDER — ALLOPURINOL 300 MG PO TABS: 300.0000 mg | ORAL_TABLET | Freq: Every day | ORAL | 1 refills | Status: DC

## 2020-12-07 MED ORDER — ESCITALOPRAM OXALATE 10 MG PO TABS: 10.0000 mg | ORAL_TABLET | Freq: Every day | ORAL | 1 refills | Status: DC

## 2020-12-07 NOTE — Patient Instructions (Signed)
Escitalopram tablets What is this medicine? ESCITALOPRAM (es sye TAL oh pram) is used to treat depression and certain types of anxiety. This medicine may be used for other purposes; ask your health care provider or pharmacist if you have questions. COMMON BRAND NAME(S): Lexapro What should I tell my health care provider before I take this medicine? They need to know if you have any of these conditions:  bipolar disorder or a family history of bipolar disorder  diabetes  glaucoma  heart disease  kidney or liver disease  receiving electroconvulsive therapy  seizures (convulsions)  suicidal thoughts, plans, or attempt by you or a family member  an unusual or allergic reaction to escitalopram, the related drug citalopram, other medicines, foods, dyes, or preservatives  pregnant or trying to become pregnant  breast-feeding How should I use this medicine? Take this medicine by mouth with a glass of water. Follow the directions on the prescription label. You can take it with or without food. If it upsets your stomach, take it with food. Take your medicine at regular intervals. Do not take it more often than directed. Do not stop taking this medicine suddenly except upon the advice of your doctor. Stopping this medicine too quickly may cause serious side effects or your condition may worsen. A special MedGuide will be given to you by the pharmacist with each prescription and refill. Be sure to read this information carefully each time. Talk to your pediatrician regarding the use of this medicine in children. Special care may be needed. Overdosage: If you think you have taken too much of this medicine contact a poison control center or emergency room at once. NOTE: This medicine is only for you. Do not share this medicine with others. What if I miss a dose? If you miss a dose, take it as soon as you can. If it is almost time for your next dose, take only that dose. Do not take double or  extra doses. What may interact with this medicine? Do not take this medicine with any of the following medications:  certain medicines for fungal infections like fluconazole, itraconazole, ketoconazole, posaconazole, voriconazole  cisapride  citalopram  dronedarone  linezolid  MAOIs like Carbex, Eldepryl, Marplan, Nardil, and Parnate  methylene blue (injected into a vein)  pimozide  thioridazine This medicine may also interact with the following medications:  alcohol  amphetamines  aspirin and aspirin-like medicines  carbamazepine  certain medicines for depression, anxiety, or psychotic disturbances  certain medicines for migraine headache like almotriptan, eletriptan, frovatriptan, naratriptan, rizatriptan, sumatriptan, zolmitriptan  certain medicines for sleep  certain medicines that treat or prevent blood clots like warfarin, enoxaparin, dalteparin  cimetidine  diuretics  dofetilide  fentanyl  furazolidone  isoniazid  lithium  metoprolol  NSAIDs, medicines for pain and inflammation, like ibuprofen or naproxen  other medicines that prolong the QT interval (cause an abnormal heart rhythm)  procarbazine  rasagiline  supplements like St. John's wort, kava kava, valerian  tramadol  tryptophan  ziprasidone This list may not describe all possible interactions. Give your health care provider a list of all the medicines, herbs, non-prescription drugs, or dietary supplements you use. Also tell them if you smoke, drink alcohol, or use illegal drugs. Some items may interact with your medicine. What should I watch for while using this medicine? Tell your doctor if your symptoms do not get better or if they get worse. Visit your doctor or health care professional for regular checks on your progress. Because it may   take several weeks to see the full effects of this medicine, it is important to continue your treatment as prescribed by your doctor. Patients  and their families should watch out for new or worsening thoughts of suicide or depression. Also watch out for sudden changes in feelings such as feeling anxious, agitated, panicky, irritable, hostile, aggressive, impulsive, severely restless, overly excited and hyperactive, or not being able to sleep. If this happens, especially at the beginning of treatment or after a change in dose, call your health care professional. You may get drowsy or dizzy. Do not drive, use machinery, or do anything that needs mental alertness until you know how this medicine affects you. Do not stand or sit up quickly, especially if you are an older patient. This reduces the risk of dizzy or fainting spells. Alcohol may interfere with the effect of this medicine. Avoid alcoholic drinks. Your mouth may get dry. Chewing sugarless gum or sucking hard candy, and drinking plenty of water may help. Contact your doctor if the problem does not go away or is severe. What side effects may I notice from receiving this medicine? Side effects that you should report to your doctor or health care professional as soon as possible:  allergic reactions like skin rash, itching or hives, swelling of the face, lips, or tongue  anxious  black, tarry stools  changes in vision  confusion  elevated mood, decreased need for sleep, racing thoughts, impulsive behavior  eye pain  fast, irregular heartbeat  feeling faint or lightheaded, falls  feeling agitated, angry, or irritable  hallucination, loss of contact with reality  loss of balance or coordination  loss of memory  painful or prolonged erections  restlessness, pacing, inability to keep still  seizures  stiff muscles  suicidal thoughts or other mood changes  trouble sleeping  unusual bleeding or bruising  unusually weak or tired  vomiting Side effects that usually do not require medical attention (report to your doctor or health care professional if they  continue or are bothersome):  changes in appetite  change in sex drive or performance  headache  increased sweating  indigestion, nausea  tremors This list may not describe all possible side effects. Call your doctor for medical advice about side effects. You may report side effects to FDA at 1-800-FDA-1088. Where should I keep my medicine? Keep out of reach of children. Store at room temperature between 15 and 30 degrees C (59 and 86 degrees F). Throw away any unused medicine after the expiration date. NOTE: This sheet is a summary. It may not cover all possible information. If you have questions about this medicine, talk to your doctor, pharmacist, or health care provider.  2020 Elsevier/Gold Standard (2018-12-07 11:21:44)  

## 2020-12-07 NOTE — Progress Notes (Signed)
[facility]  HPI  [name] is a  24 y.o.male presents for follow up from the [facility]  on 11/25/20, patient was discharged from the [facility] the same day treated for Acute idiopathic gout involving toe of right foot with prednisone dose pack .    No past medical history on file.   No Known Allergies    Current Outpatient Medications on File Prior to Visit  Medication Sig Dispense Refill  . colchicine 0.6 MG tablet Take two tablets (1.3 mg) at the onset of a gout attack and may repeat 1 tab (0.6 mg) after 1 hour if symptoms persist. 30 tablet 1  . allopurinol (ZYLOPRIM) 300 MG tablet Take 1 tablet (300 mg total) by mouth daily. (Patient not taking: Reported on 12/07/2020) 30 tablet 3  . escitalopram (LEXAPRO) 10 MG tablet Take 1 tablet by mouth once daily (Patient not taking: Reported on 12/07/2020) 30 tablet 0  . indomethacin (INDOCIN) 50 MG capsule Take 1 capsule (50 mg total) by mouth 3 (three) times daily with meals. 15 capsule 0  . [DISCONTINUED] dicyclomine (BENTYL) 10 MG capsule Take 1 tab every 6 hours as needed for abdominal cramping. 60 capsule 0   No current facility-administered medications on file prior to visit.    ROS: all negative except above.   Physical Exam: Filed Weights   12/07/20 0925  Weight: 247 lb 9.6 oz (112.3 kg)   BP 134/88 (BP Location: Right Arm, Patient Position: Sitting, Cuff Size: Large)   Pulse (!) 105   Temp (!) 97.3 F (36.3 C) (Temporal)   Ht 6' (1.829 m)   Wt 247 lb 9.6 oz (112.3 kg)   SpO2 96%   BMI 33.58 kg/m  General Appearance: Well nourished, in no apparent distress. Eyes: PERRLA, EOMs, conjunctiva no swelling or erythema Sinuses: No Frontal/maxillary tenderness Hearing normal.  Neck: Supple, thyroid normal.  Respiratory: Respiratory effort normal, BS equal bilaterally without rales, rhonchi, wheezing or stridor.  Cardio: RRR with no MRGs. Brisk peripheral pulses without edema.  Abdomen:  Soft, + BS.  Non tender, no guarding, rebound, hernias, masses. Lymphatics: Non tender without lymphadenopathy.  Musculoskeletal: Full ROM, 5/5 strength, normal gait.  Skin: Warm, dry without rashes, lesions, ecchymosis.  Neuro: Cranial nerves intact. Normal muscle tone, no cerebellar symptoms. Sensation intact.  Psych: Awake and oriented X 3, normal affect, Insight and Judgment appropriate.    Yazid was seen today for gout.  Diagnoses and all orders for this visit:  Hospital discharge follow-up   Acute idiopathic gout involving toe of right foot  Treated with prednisone dose pack . In today for follow up and management of gout  Adjustment disorder with mixed anxiety and depressed mood Realized he was spiraling down increase anxiety and depression Stopped taking Lexapro abruptly . Discussed do not stop taking this medicine suddenly except upon the advice of your doctor. Stopping this medicine too quickly may cause serious side effects or your condition may worsen.  Idiopathic gout involving toe of right foot, unspecified chronicity Discussed increase risk causes red meats, shell seafood, fermented foods and red wines. Admits to eating steak prior to flare up  Other orders/Medication refill -     allopurinol (ZYLOPRIM) 300 MG tablet; Take 1 tablet (300 mg total) by mouth daily. Do not take together  -     colchicine 0.6 MG tablet; Take two tablets (1.3 mg) at the onset of a gout attack and may repeat 1 tab (0.6 mg)  after 1 hour if symptoms persist.   [name], NP 9:48 AM

## 2020-12-20 ENCOUNTER — Ambulatory Visit (INDEPENDENT_AMBULATORY_CARE_PROVIDER_SITE_OTHER): Payer: Self-pay

## 2020-12-20 ENCOUNTER — Other Ambulatory Visit: Payer: Self-pay

## 2020-12-20 ENCOUNTER — Ambulatory Visit (INDEPENDENT_AMBULATORY_CARE_PROVIDER_SITE_OTHER): Payer: BLUE CROSS/BLUE SHIELD | Admitting: Primary Care

## 2020-12-20 ENCOUNTER — Encounter (INDEPENDENT_AMBULATORY_CARE_PROVIDER_SITE_OTHER): Payer: Self-pay | Admitting: Primary Care

## 2020-12-20 VITALS — BP 152/94 | HR 86 | Temp 97.3°F | Ht 72.0 in | Wt 247.4 lb

## 2020-12-20 DIAGNOSIS — J309 Allergic rhinitis, unspecified: Secondary | ICD-10-CM | POA: Diagnosis not present

## 2020-12-20 DIAGNOSIS — M79674 Pain in right toe(s): Secondary | ICD-10-CM

## 2020-12-20 DIAGNOSIS — R030 Elevated blood-pressure reading, without diagnosis of hypertension: Secondary | ICD-10-CM

## 2020-12-20 MED ORDER — PREDNISONE 10 MG PO TABS: 10.0000 mg | ORAL_TABLET | Freq: Four times a day (QID) | ORAL | 0 refills | Status: DC

## 2020-12-20 MED ORDER — FLUTICASONE PROPIONATE 50 MCG/ACT NA SUSP: 2.0000 | Freq: Every day | NASAL | 6 refills | Status: DC

## 2020-12-20 MED ORDER — CETIRIZINE HCL 10 MG PO TABS: 10.0000 mg | ORAL_TABLET | Freq: Every day | ORAL | 11 refills | Status: AC

## 2020-12-20 NOTE — Progress Notes (Signed)
Acute Office Visit  Subjective:    Patient ID: Jeff Vega, male    DOB: 1996/12/02, 24 y.o.   MRN: [id number]  Chief Complaint  Patient presents with  . Gout       . Foot Swelling    Right     HPI Jeff Vega is 24 year old man returns for another  acute gout flare up.today he is limping his great right toe and foot is swollen , warm and painful. Discussed his diet again and taking his medication. He did not take colchicine 0.6mg  x's 2 for initial attack and than follow up in 1 hour if symptoms persist.   History reviewed. No pertinent past medical history.  Past Surgical History:  Procedure Laterality Date  . ADENOIDECTOMY    . TONSILLECTOMY      Family History  Problem Relation Age of Onset  . Diabetes Father   . Healthy Mother     Social History   Socioeconomic History  . Marital status: Single    Spouse name: Not on file  . Number of children: Not on file  . Years of education: Not on file  . Highest education level: Not on file  Occupational History  . Not on file  Tobacco Use  . Smoking status: Never Smoker  . Smokeless tobacco: Never Used  Vaping Use  . Vaping Use: Never used  Substance and Sexual Activity  . Alcohol use: No  . Drug use: No  . Sexual activity: Not on file  Other Topics Concern  . Not on file  Social History Narrative  . Not on file   Social Determinants of Health   Financial Resource Strain: Not on file  Food Insecurity: Not on file  Transportation Needs: Not on file  Physical Activity: Not on file  Stress: Not on file  Social Connections: Not on file  Intimate Partner Violence: Not on file    Outpatient Medications Prior to Visit  Medication Sig Dispense Refill  . allopurinol (ZYLOPRIM) 300 MG tablet Take 1 tablet (300 mg total) by mouth daily. 90 tablet 1  . colchicine 0.6 MG tablet Take two tablets (1.3 mg) at the onset of a gout attack and may repeat 1 tab (0.6 mg) after 1 hour if symptoms persist. 30 tablet 1   . escitalopram (LEXAPRO) 10 MG tablet Take 1 tablet (10 mg total) by mouth daily. 30 tablet 1   No facility-administered medications prior to visit.    No Known Allergies  Review of Systems  Skin: Positive for color change.       Warm, swollen foot   All other systems reviewed and are negative.      Objective:    Physical Exam Vitals reviewed.  Constitutional:      General: He is in acute distress.  HENT:     Head: Normocephalic.     Right Ear: External ear normal.     Left Ear: External ear normal.     Nose: Nose normal.  Cardiovascular:     Rate and Rhythm: Normal rate and regular rhythm.  Pulmonary:     Effort: Pulmonary effort is normal.     Breath sounds: Normal breath sounds.  Abdominal:     General: Bowel sounds are normal.     Palpations: Abdomen is soft.  Musculoskeletal:        General: Normal range of motion.     Cervical back: Normal range of motion.  Skin:    General: Skin  is warm.     Findings: Erythema present.     Comments: Right foot  Neurological:     Mental Status: He is alert and oriented to person, place, and time.  Psychiatric:        Mood and Affect: Mood normal.        Behavior: Behavior normal.        Thought Content: Thought content normal.        Judgment: Judgment normal.     BP (!) 152/94 (BP Location: Left Arm, Patient Position: Sitting, Cuff Size: Large)   Pulse 86   Temp (!) 97.3 F (36.3 C) (Temporal)   Ht 6' (1.829 m)   Wt 247 lb 6.4 oz (112.2 kg)   SpO2 96%   BMI 33.55 kg/m  Wt Readings from Last 3 Encounters:  12/20/20 247 lb 6.4 oz (112.2 kg)  12/07/20 247 lb 9.6 oz (112.3 kg)  10/21/19 262 lb 9.6 oz (119.1 kg)    Health Maintenance Due  Topic Date Due  . Hepatitis C Screening  Never done  . COVID-19 Vaccine (1) Never done  . HIV Screening  Never done    There are no preventive care reminders to display for this patient.   No results found for: TSH Lab Results  Component Value Date   WBC 7.4  03/19/2015   HGB 15.8 03/19/2015   HCT 45.6 03/19/2015   MCV 84.4 03/19/2015   PLT 255 03/19/2015   Lab Results  Component Value Date   NA 139 03/19/2015   K 4.8 03/19/2015   CO2 29 03/19/2015   GLUCOSE 124 (H) 03/19/2015   BUN 8 03/19/2015   CREATININE 1.14 03/19/2015   BILITOT 1.2 03/19/2015   ALKPHOS 54 03/19/2015   AST 49 (H) 03/19/2015   ALT 17 03/19/2015   PROT 8.1 03/19/2015   ALBUMIN 4.7 03/19/2015   CALCIUM 9.8 03/19/2015   ANIONGAP 7 03/19/2015   No results found for: CHOL No results found for: HDL No results found for: LDLCALC No results found for: TRIG No results found for: CHOLHDL No results found for: MHDQ2I     Assessment & Plan:   Jeff Vega was seen today for gout and foot swelling.  Diagnoses and all orders for this visit:  Pain of toe of right foot -     predniSONE (DELTASONE) 10 MG tablet; Take 1 tablet (10 mg total) by mouth taper from 4 doses each day to 1 dose and stop. Take 6 tabs day 1, Day 2( 5 tabs ) Day 3 (4 tab) Day 4 (3 tab) Day 5 (2Tab) Day 6 (1 tab)  Elevated blood pressure reading in office without diagnosis of hypertension Discussed risk factors AAM, Body mass index is 33.55 kg/m.  Risk for heart attack and stroke life style modification on f/u visit if no improvement we have agreed to starting Bp medication.   Allergic rhinitis, unspecified seasonality, unspecified trigger -     fluticasone (FLONASE) 50 MCG/ACT nasal spray; Place 2 sprays into both nostrils daily. -     cetirizine (ZYRTEC) 10 MG tablet; Take 1 tablet (10 mg total) by mouth daily.   Meds ordered this encounter  Medications  . predniSONE (DELTASONE) 10 MG tablet    Sig: Take 1 tablet (10 mg total) by mouth taper from 4 doses each day to 1 dose and stop. Take 6 tabs day 1, Day 2( 5 tabs ) Day 3 (4 tab) Day 4 (3 tab) Day 5 (2Tab) Day 6 (1  tab)    Dispense:  21 tablet    Refill:  0  . fluticasone (FLONASE) 50 MCG/ACT nasal spray    Sig: Place 2 sprays into both nostrils  daily.    Dispense:  16 g    Refill:  6  . cetirizine (ZYRTEC) 10 MG tablet    Sig: Take 1 tablet (10 mg total) by mouth daily.    Dispense:  30 tablet    Refill:  11     [name], NP

## 2020-12-20 NOTE — Telephone Encounter (Signed)
Patient called and says he's having gout flare to the right foot with swelling and pain. He say he's unable to walk on it due to the pain and swelling. The pain is a 7-8/10. Denies any other symptoms. Appointment scheduled for today at 1330 with Gwinda Passe, NP, care advice given, patient verbalized understanding.  Reason for Disposition . [1] MODERATE pain (e.g., interferes with normal activities, limping) AND [2] present > 3 days  Answer Assessment - Initial Assessment Questions 1. ONSET: "When did the pain start?"      1 month ago 2. LOCATION: "Where is the pain located?"      Right foot 3. PAIN: "How bad is the pain?"    (Scale 1-10; or mild, moderate, severe)  - MILD (1-3): doesn't interfere with normal activities.   - MODERATE (4-7): interferes with normal activities (e.g., work or school) or awakens from sleep, limping.   - SEVERE (8-10): excruciating pain, unable to do any normal activities, unable to walk.      7-8 4. WORK OR EXERCISE: "Has there been any recent work or exercise that involved this part of the body?"      No 5. CAUSE: "What do you think is causing the foot pain?"     Gout flare that is starting to come back 6. OTHER SYMPTOMS: "Do you have any other symptoms?" (e.g., leg pain, rash, fever, numbness)     Swelling to the right foot 7. PREGNANCY: "Is there any chance you are pregnant?" "When was your last menstrual period?"     N/A  Protocols used: FOOT PAIN-A-AH

## 2020-12-20 NOTE — Patient Instructions (Signed)
 Gout  Gout is painful swelling of your joints. Gout is a type of arthritis. It is caused by having too much uric acid in your body. Uric acid is a chemical that is made when your body breaks down substances called purines. If your body has too much uric acid, sharp crystals can form and build up in your joints. This causes pain and swelling. Gout attacks can happen quickly and be very painful (acute gout). Over time, the attacks can affect more joints and happen more often (chronic gout). What are the causes?  Too much uric acid in your blood. This can happen because: ? Your kidneys do not remove enough uric acid from your blood. ? Your body makes too much uric acid. ? You eat too many foods that are high in purines. These foods include organ meats, some seafood, and beer.  Trauma or stress. What increases the risk?  Having a family history of gout.  Being male and middle-aged.  Being male and having gone through menopause.  Being very overweight (obese).  Drinking alcohol, especially beer.  Not having enough water in the body (being dehydrated).  Losing weight too quickly.  Having an organ transplant.  Having lead poisoning.  Taking certain medicines.  Having kidney disease.  Having a skin condition called psoriasis. What are the signs or symptoms? An attack of acute gout usually happens in just one joint. The most common place is the big toe. Attacks often start at night. Other joints that may be affected include joints of the feet, ankle, knee, fingers, wrist, or elbow. Symptoms of an attack may include:  Very bad pain.  Warmth.  Swelling.  Stiffness.  Shiny, red, or purple skin.  Tenderness. The affected joint may be very painful to touch.  Chills and fever. Chronic gout may cause symptoms more often. More joints may be involved. You may also have white or yellow lumps (tophi) on your hands or feet or in other areas near your joints. How is this  treated?  Treatment for this condition has two phases: treating an acute attack and preventing future attacks.  Acute gout treatment may include: ? NSAIDs. ? Steroids. These are taken by mouth or injected into a joint. ? Colchicine. This medicine relieves pain and swelling. It can be given by mouth or through an IV tube.  Preventive treatment may include: ? Taking small doses of NSAIDs or colchicine daily. ? Using a medicine that reduces uric acid levels in your blood. ? Making changes to your diet. You may need to see a food expert (dietitian) about what to eat and drink to prevent gout. Follow these instructions at home: During a gout attack   If told, put ice on the painful area: ? Put ice in a plastic bag. ? Place a towel between your skin and the bag. ? Leave the ice on for 20 minutes, 2-3 times a day.  Raise (elevate) the painful joint above the level of your heart as often as you can.  Rest the joint as much as possible. If the joint is in your leg, you may be given crutches.  Follow instructions from your doctor about what you cannot eat or drink. Avoiding future gout attacks  Eat a low-purine diet. Avoid foods and drinks such as: ? Liver. ? Kidney. ? Anchovies. ? Asparagus. ? Herring. ? Mushrooms. ? Mussels. ? Beer.  Stay at a healthy weight. If you want to lose weight, talk with your doctor. Do not lose   weight too fast.  Start or continue an exercise plan as told by your doctor. Eating and drinking  Drink enough fluids to keep your pee (urine) pale yellow.  If you drink alcohol: ? Limit how much you use to:  0-1 drink a day for women.  0-2 drinks a day for men. ? Be aware of how much alcohol is in your drink. In the [place] one drink equals one 12 oz bottle of beer (355 mL), one 5 oz glass of wine (148 mL), or one 1 oz glass of hard liquor (44 mL). General instructions  Take over-the-counter and prescription medicines only as told by your doctor.  Do  not drive or use heavy machinery while taking prescription pain medicine.  Return to your normal activities as told by your doctor. Ask your doctor what activities are safe for you.  Keep all follow-up visits as told by your doctor. This is important. Contact a doctor if:  You have another gout attack.  You still have symptoms of a gout attack after 10 days of treatment.  You have problems (side effects) because of your medicines.  You have chills or a fever.  You have burning pain when you pee (urinate).  You have pain in your lower back or belly. Get help right away if:  You have very bad pain.  Your pain cannot be controlled.  You cannot pee. Summary  Gout is painful swelling of the joints.  The most common site of pain is the big toe, but it can affect other joints.  Medicines and avoiding some foods can help to prevent and treat gout attacks. This information is not intended to replace advice given to you by your health care provider. Make sure you discuss any questions you have with your health care provider. Document Revised: 07/08/2018 Document Reviewed: 07/08/2018 Elsevier Patient Education  2020 [employer]  

## 2021-01-25 ENCOUNTER — Ambulatory Visit (INDEPENDENT_AMBULATORY_CARE_PROVIDER_SITE_OTHER): Payer: BLUE CROSS/BLUE SHIELD | Admitting: Primary Care

## 2021-02-01 ENCOUNTER — Ambulatory Visit (INDEPENDENT_AMBULATORY_CARE_PROVIDER_SITE_OTHER): Payer: BLUE CROSS/BLUE SHIELD | Admitting: Primary Care

## 2021-06-19 ENCOUNTER — Other Ambulatory Visit (INDEPENDENT_AMBULATORY_CARE_PROVIDER_SITE_OTHER): Payer: Self-pay | Admitting: Primary Care

## 2021-06-19 ENCOUNTER — Telehealth (INDEPENDENT_AMBULATORY_CARE_PROVIDER_SITE_OTHER): Payer: Self-pay

## 2021-06-19 NOTE — Telephone Encounter (Signed)
Copied from CRM [id number]. Topic: General - Other >> Jun 19, 2021  8:58 AM Jaquita Rector A wrote: Reason for CRM: Patient asking for a call back from Ms Randa Evens please to discuss gout flare up can be reached at Ph#  [phone]

## 2021-06-19 NOTE — Telephone Encounter (Signed)
Copied from CRM [id number]. Topic: Quick Communication - Rx Refill/Question >> Jun 19, 2021  8:59 AM Jaquita Rector A wrote: Medication: allopurinol (ZYLOPRIM) 300 MG tablet, colchicine 0.6 MG tablet  Having a flare up now  Has the patient contacted their pharmacy? No. (Agent: If no, request that the patient contact the pharmacy for the refill.) (Agent: If yes, when and what did the pharmacy advise?)  Preferred Pharmacy (with phone number or street name): [id number] - [place], [place] - [address] AT [facility] [address]  Phone:  [phone] Fax:  [phone]     Agent: Please be advised that RX refills may take up to 3 business days. We ask that you follow-up with your pharmacy.

## 2021-06-19 NOTE — Telephone Encounter (Signed)
Notes to clinic: Patient states he is having a gout flare up    Requested Prescriptions  Pending Prescriptions Disp Refills   colchicine 0.6 MG tablet 30 tablet 1    Sig: Take two tablets (1.3 mg) at the onset of a gout attack and may repeat 1 tab (0.6 mg) after 1 hour if symptoms persist.      Endocrinology:  Gout Agents Failed - 06/19/2021  9:06 AM      Failed - Uric Acid in normal range and within 360 days    Uric Acid  Date Value Ref Range Status  08/29/2020 10.0 (H) 3.8 - 8.4 mg/dL Final    Comment:               Therapeutic target for gout patients: <6.0          Failed - Cr in normal range and within 360 days    Creatinine, Ser  Date Value Ref Range Status  03/19/2015 1.14 0.50 - 1.35 mg/dL Final          Passed - Valid encounter within last 12 months    Recent Outpatient Visits           6 months ago Pain of toe of right foot   [facility] [name], NP   6 months ago Hospital discharge follow-up   [facility] Gwinda Passe P, NP   9 months ago Pain of toe of right foot   [facility] Hoy Register, MD   1 year ago Adjustment disorder with mixed anxiety and depressed mood   [facility] [name], NP   1 year ago Encounter to establish care   [facility] [name], NP       Future Appointments             In 2 weeks Randa Evens, Kinnie Scales, NP [facility]               allopurinol (ZYLOPRIM) 300 MG tablet 90 tablet 1    Sig: Take 1 tablet (300 mg total) by mouth daily.      Endocrinology:  Gout Agents Failed - 06/19/2021  9:06 AM      Failed - Uric Acid in normal range and within 360 days    Uric Acid  Date Value Ref Range Status  08/29/2020 10.0 (H) 3.8 - 8.4 mg/dL Final    Comment:               Therapeutic target for gout patients: <6.0          Failed - Cr in normal  range and within 360 days    Creatinine, Ser  Date Value Ref Range Status  03/19/2015 1.14 0.50 - 1.35 mg/dL Final          Passed - Valid encounter within last 12 months    Recent Outpatient Visits           6 months ago Pain of toe of right foot   [facility] [name], NP   6 months ago Hospital discharge follow-up   [facility] Gwinda Passe P, NP   9 months ago Pain of toe of right foot   [facility] Hoy Register, MD   1 year ago Adjustment disorder with mixed anxiety and depressed mood  [facility] [name], NP   1 year ago Encounter to establish care   [facility] [name], NP       Future Appointments             In 2 weeks [name], NP [facility]

## 2021-06-20 ENCOUNTER — Ambulatory Visit: Payer: BLUE CROSS/BLUE SHIELD | Admitting: Physician Assistant

## 2021-06-20 ENCOUNTER — Other Ambulatory Visit: Payer: Self-pay

## 2021-06-20 VITALS — BP 134/98 | HR 76 | Temp 98.7°F | Resp 18 | Ht 73.0 in | Wt 243.0 lb

## 2021-06-20 DIAGNOSIS — R030 Elevated blood-pressure reading, without diagnosis of hypertension: Secondary | ICD-10-CM | POA: Diagnosis not present

## 2021-06-20 DIAGNOSIS — M109 Gout, unspecified: Secondary | ICD-10-CM

## 2021-06-20 DIAGNOSIS — Z6832 Body mass index (BMI) 32.0-32.9, adult: Secondary | ICD-10-CM | POA: Diagnosis not present

## 2021-06-20 DIAGNOSIS — E6609 Other obesity due to excess calories: Secondary | ICD-10-CM

## 2021-06-20 MED ORDER — INDOMETHACIN 50 MG PO CAPS: 50.0000 mg | ORAL_CAPSULE | Freq: Three times a day (TID) | ORAL | 0 refills | Status: AC

## 2021-06-20 MED ORDER — COLCHICINE 0.6 MG PO TABS: ORAL_TABLET | ORAL | 1 refills | Status: DC

## 2021-06-20 MED ORDER — PREDNISONE 10 MG PO TABS: 10.0000 mg | ORAL_TABLET | Freq: Four times a day (QID) | ORAL | 0 refills | Status: DC

## 2021-06-20 NOTE — Progress Notes (Signed)
Established Patient Office Visit  Subjective:  Patient ID: Jeff Vega, male    DOB: May 18, 1996  Age: 25 y.o. MRN: [id number]  CC:  Chief Complaint  Patient presents with   Gout    Left foot    HPI Jeff Vega reports that he started having pain in his left great toe 2 days ago.  States that he has a significant history of gout, and states this feels exactly the same.  States that his gout has generally been on his right great toe. Denies injury/trauma  Reports that he was previously on allopurinol, but did not get a refill after taking it for the first 30 days.  Reports that he has been working on lifestyle modifications to help avoid gout attacks.  Is drinking a lot of water, is avoiding foods that can trigger, but does endorse that he still drinks alcohol on occasion.  States that he does not check his blood pressure at home.     History reviewed. No pertinent past medical history.  Past Surgical History:  Procedure Laterality Date   ADENOIDECTOMY     TONSILLECTOMY      Family History  Problem Relation Age of Onset   Diabetes Father    Healthy Mother     Social History   Socioeconomic History   Marital status: Single    Spouse name: Not on file   Number of children: Not on file   Years of education: Not on file   Highest education level: Not on file  Occupational History   Not on file  Tobacco Use   Smoking status: Never   Smokeless tobacco: Never  Vaping Use   Vaping Use: Never used  Substance and Sexual Activity   Alcohol use: No   Drug use: No   Sexual activity: Not Currently  Other Topics Concern   Not on file  Social History Narrative   Not on file   Social Determinants of Health   Financial Resource Strain: Not on file  Food Insecurity: Not on file  Transportation Needs: Not on file  Physical Activity: Not on file  Stress: Not on file  Social Connections: Not on file  Intimate Partner Violence: Not on file    Outpatient Medications  Prior to Visit  Medication Sig Dispense Refill   cetirizine (ZYRTEC) 10 MG tablet Take 1 tablet (10 mg total) by mouth daily. 30 tablet 11   escitalopram (LEXAPRO) 10 MG tablet Take 1 tablet (10 mg total) by mouth daily. 30 tablet 1   allopurinol (ZYLOPRIM) 300 MG tablet Take 1 tablet (300 mg total) by mouth daily. (Patient not taking: Reported on 06/20/2021) 90 tablet 1   fluticasone (FLONASE) 50 MCG/ACT nasal spray Place 2 sprays into both nostrils daily. (Patient not taking: Reported on 06/20/2021) 16 g 6   colchicine 0.6 MG tablet Take two tablets (1.3 mg) at the onset of a gout attack and may repeat 1 tab (0.6 mg) after 1 hour if symptoms persist. (Patient not taking: Reported on 06/20/2021) 30 tablet 1   predniSONE (DELTASONE) 10 MG tablet Take 1 tablet (10 mg total) by mouth taper from 4 doses each day to 1 dose and stop. Take 6 tabs day 1, Day 2( 5 tabs ) Day 3 (4 tab) Day 4 (3 tab) Day 5 (2Tab) Day 6 (1 tab) (Patient not taking: Reported on 06/20/2021) 21 tablet 0   No facility-administered medications prior to visit.    No Known Allergies  ROS Review of Systems  Constitutional:  Negative for chills and fever.  HENT: Negative.    Eyes: Negative.   Respiratory:  Negative for shortness of breath.   Cardiovascular:  Negative for chest pain.  Gastrointestinal:  Negative for nausea and vomiting.  Endocrine: Negative.   Genitourinary: Negative.   Musculoskeletal:  Positive for gait problem and joint swelling.  Skin: Negative.   Allergic/Immunologic: Negative.   Neurological:  Negative for headaches.  Hematological: Negative.   Psychiatric/Behavioral: Negative.       Objective:    Physical Exam Vitals and nursing note reviewed.  Constitutional:      Appearance: Normal appearance.  HENT:     Head: Normocephalic and atraumatic.     Right Ear: External ear normal.     Left Ear: External ear normal.     Nose: Nose normal.     Mouth/Throat:     Mouth: Mucous membranes are moist.      Pharynx: Oropharynx is clear.  Eyes:     Extraocular Movements: Extraocular movements intact.     Conjunctiva/sclera: Conjunctivae normal.     Pupils: Pupils are equal, round, and reactive to light.  Cardiovascular:     Rate and Rhythm: Normal rate and regular rhythm.     Pulses: Normal pulses.     Heart sounds: Normal heart sounds.  Pulmonary:     Effort: Pulmonary effort is normal.     Breath sounds: Normal breath sounds.  Musculoskeletal:     Cervical back: Normal range of motion and neck supple.     Right foot: Normal.       Legs:     Comments: Using cane to help support  Skin:    General: Skin is warm and dry.  Neurological:     General: No focal deficit present.     Mental Status: He is alert and oriented to person, place, and time.  Psychiatric:        Mood and Affect: Mood normal.        Behavior: Behavior normal.        Thought Content: Thought content normal.        Judgment: Judgment normal.    BP (!) (P) 127/97 (BP Location: Left Arm, Patient Position: Sitting, Cuff Size: Normal)   Pulse 76   Temp 98.7 F (37.1 C) (Oral)   Resp 18   Ht 6\' 1"  (1.854 m)   Wt 243 lb (110.2 kg)   SpO2 100%   BMI 32.06 kg/m  Wt Readings from Last 3 Encounters:  06/20/21 243 lb (110.2 kg)  12/20/20 247 lb 6.4 oz (112.2 kg)  12/07/20 247 lb 9.6 oz (112.3 kg)     Health Maintenance Due  Topic Date Due   COVID-19 Vaccine (1) Never done   HIV Screening  Never done   Hepatitis C Screening  Never done    There are no preventive care reminders to display for this patient.  No results found for: TSH Lab Results  Component Value Date   WBC 7.4 03/19/2015   HGB 15.8 03/19/2015   HCT 45.6 03/19/2015   MCV 84.4 03/19/2015   PLT 255 03/19/2015   Lab Results  Component Value Date   NA 139 03/19/2015   K 4.8 03/19/2015   CO2 29 03/19/2015   GLUCOSE 124 (H) 03/19/2015   BUN 8 03/19/2015   CREATININE 1.14 03/19/2015   BILITOT 1.2 03/19/2015   ALKPHOS 54 03/19/2015    AST 49 (H) 03/19/2015   ALT 17 03/19/2015  PROT 8.1 03/19/2015   ALBUMIN 4.7 03/19/2015   CALCIUM 9.8 03/19/2015   ANIONGAP 7 03/19/2015   No results found for: CHOL No results found for: HDL No results found for: LDLCALC No results found for: TRIG No results found for: CHOLHDL No results found for: [id number]    Assessment & Plan:   Problem List Items Addressed This Visit       Musculoskeletal and Integument   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    Dispense:  30 tablet    Refill:  1    Order Specific Question:   Supervising Provider    Answer:   Shan LevansWRIGHT, PATRICK E [[id number]]   predniSONE (DELTASONE) 10 MG tablet    Sig: Take 1 tablet (10 mg total) by mouth taper from 4 doses each day to 1 dose and stop. Take 6 tabs day 1, Day 2( 5 tabs ) Day 3 (4 tab) Day 4 (3 tab) Day 5 (2Tab) Day 6 (1 tab)    Dispense:  21 tablet    Refill:  0    Order Specific Question:   Supervising Provider    Answer:   Shan LevansWRIGHT, PATRICK E [[id number]]   indomethacin (INDOCIN) 50 MG capsule    Sig: Take 1 capsule (50 mg total) by mouth 3 (three) times daily with meals for 10 days.    Dispense:  30 capsule    Refill:  0    Order Specific Question:   Supervising Provider    Answer:   WRIGHT, PATRICK E [[id number]]   1. Gouty arthritis of left great toe Trial colchicine, prednisone taper, indomethacin.  Patient education given on returning for labs to determine uric acid levels, and being able to restart  allopurinol, proper use of allopurinol discussed.  Red flags given for prompt reevaluation - colchicine 0.6 MG tablet; Take two tablets (1.3 mg) at the onset of a gout attack and may repeat 1 tab (0.6 mg) after 1 hour if symptoms persist.  Dispense: 30 tablet; Refill: 1 - predniSONE (DELTASONE) 10 MG tablet; Take 1 tablet (10 mg total) by mouth taper from 4 doses each day to 1 dose and stop. Take 6 tabs day 1, Day 2( 5 tabs ) Day 3 (4 tab) Day 4 (3 tab) Day 5 (2Tab) Day 6 (1 tab)  Dispense: 21 tablet; Refill: 0 - indomethacin (INDOCIN) 50 MG capsule; Take 1 capsule (50 mg total) by mouth 3 (three) times daily with meals for 10 days.  Dispense: 30 capsule; Refill: 0  2. Elevated blood pressure reading in office without diagnosis of hypertension Patient encouraged to check blood pressure at home, keep a written log and have available for all office visits.  Patient encouraged to follow-up with primary care provider or return to the mobile unit if blood pressure readings are elevated.  3. Class 1 obesity due to excess calories with body  mass index (BMI) of 32.0 to 32.9 in adult, unspecified whether serious comorbidity present    I have reviewed the patient's medical history (PMH, PSH, Social History, Family History, Medications, and allergies) , and have been updated if relevant. I spent 31 minutes reviewing chart and  face to face time with patient.   Follow-up: Return if symptoms worsen or fail to improve.    Kasandra Knudsen Mayers, PA-C

## 2021-06-20 NOTE — Patient Instructions (Signed)
You will use a prednisone taper, and colchicine as directed.  Continue to use the indomethacin 3 times a day until 2 to 3 days after your symptoms resolve.  I encourage you to check your blood pressure at home, keep a written log and have available for all office visits.  You can either keep your follow-up appointment with your primary care provider or please feel free to return to the mobile unit so we may evaluate your uric acid level and blood pressure readings.  Please let us know if there is anything else we can do for you.  Roney Jaffe, PA-C Physician Assistant [facility] [email or web address]   Gout  Gout is a condition that causes painful swelling of the joints. Gout is a type of inflammation of the joints (arthritis). This condition is caused by having too much uric acid in the body. Uric acid is a chemical that forms when the body breaks down substances called purines.Purines are important for building body proteins. When the body has too much uric acid, sharp crystals can form and build up inside the joints. This causes pain and swelling. Gout attacks can happen quickly and may be very painful (acute gout). Over time, the attacks can affect more joints and become more frequent (chronic gout). Gout can also cause uric acid to build up under the skin and inside thekidneys. What are the causes? This condition is caused by too much uric acid in your blood. This can happen because: Your kidneys do not remove enough uric acid from your blood. This is the most common cause. Your body makes too much uric acid. This can happen with some cancers and cancer treatments. It can also occur if your body is breaking down too many red blood cells (hemolytic anemia). You eat too many foods that are high in purines. These foods include organ meats and some seafood. Alcohol, especially beer, is also high in purines. A gout attack may be triggered  by trauma or stress. What increases the risk? You are more likely to develop this condition if you: Have a family history of gout. Are male and middle-aged. Are male and have gone through menopause. Are obese. Frequently drink alcohol, especially beer. Are dehydrated. Lose weight too quickly. Have an organ transplant. Have lead poisoning. Take certain medicines, including aspirin, cyclosporine, diuretics, levodopa, and niacin. Have kidney disease. Have a skin condition called psoriasis. What are the signs or symptoms? An attack of acute gout happens quickly. It usually occurs in just one joint. The most common place is the big toe. Attacks often start at night. Other joints that may be affected include joints of the feet, ankle, knee, fingers, wrist, or elbow. Symptoms of this condition may include: Severe pain. Warmth. Swelling. Stiffness. Tenderness. The affected joint may be very painful to touch. Shiny, red, or purple skin. Chills and fever. Chronic gout may cause symptoms more frequently. More joints may be involved. You may also have white or yellow lumps (tophi) on your hands or feet or in other areas near your joints. How is this diagnosed? This condition is diagnosed based on your symptoms, medical history, and physical exam. You may have tests, such as: Blood tests to measure uric acid levels. Removal of joint fluid with a thin needle (aspiration) to look for uric acid crystals. X-rays to look for joint damage. How is this treated? Treatment for this condition has two phases: treating an acute attack and preventing future attacks. Acute gout treatment may include  medicines to reduce pain and swelling, including: NSAIDs. Steroids. These are strong anti-inflammatory medicines that can be taken by mouth (orally) or injected into a joint. Colchicine. This medicine relieves pain and swelling when it is taken soon after an attack. It can be given by mouth or through an  IV. Preventive treatment may include: Daily use of smaller doses of NSAIDs or colchicine. Use of a medicine that reduces uric acid levels in your blood. Changes to your diet. You may need to see a dietitian about what to eat and drink to prevent gout. Follow these instructions at home: During a gout attack  If directed, put ice on the affected area: Put ice in a plastic bag. Place a towel between your skin and the bag. Leave the ice on for 20 minutes, 2-3 times a day. Raise (elevate) the affected joint above the level of your heart as often as possible. Rest the joint as much as possible. If the affected joint is in your leg, you may be given crutches to use. Follow instructions from your health care provider about eating or drinking restrictions.  Avoiding future gout attacks Follow a low-purine diet as told by your dietitian or health care provider. Avoid foods and drinks that are high in purines, including liver, kidney, anchovies, asparagus, herring, mushrooms, mussels, and beer. Maintain a healthy weight or lose weight if you are overweight. If you want to lose weight, talk with your health care provider. It is important that you do not lose weight too quickly. Start or maintain an exercise program as told by your health care provider. Eating and drinking Drink enough fluids to keep your urine pale yellow. If you drink alcohol: Limit how much you use to: 0-1 drink a day for women. 0-2 drinks a day for men. Be aware of how much alcohol is in your drink. In the [place] one drink equals one 12 oz bottle of beer (355 mL) one 5 oz glass of wine (148 mL), or one 1 oz glass of hard liquor (44 mL). General instructions Take over-the-counter and prescription medicines only as told by your health care provider. Do not drive or use heavy machinery while taking prescription pain medicine. Return to your normal activities as told by your health care provider. Ask your health care provider what  activities are safe for you. Keep all follow-up visits as told by your health care provider. This is important. Contact a health care provider if you have: Another gout attack. Continuing symptoms of a gout attack after 10 days of treatment. Side effects from your medicines. Chills or a fever. Burning pain when you urinate. Pain in your lower back or belly. Get help right away if you: Have severe or uncontrolled pain. Cannot urinate. Summary Gout is painful swelling of the joints caused by inflammation. The most common site of pain is the big toe, but it can affect other joints in the body. Medicines and dietary changes can help to prevent and treat gout attacks. This information is not intended to replace advice given to you by your health care provider. Make sure you discuss any questions you have with your healthcare provider. Document Revised: 07/08/2018 Document Reviewed: 07/08/2018 Elsevier Patient Education  2022 [employer].  How to Take Your Blood Pressure Blood pressure is a measurement of how strongly your blood is pressing against the walls of your arteries. Arteries are blood vessels that carry blood from your heart throughout your body. Your health care provider takes your blood pressure  at each office visit. You can also take your own blood pressure athome with a blood pressure monitor. You may need to take your own blood pressure to: Confirm a diagnosis of high blood pressure (hypertension). Monitor your blood pressure over time. Make sure your blood pressure medicine is working. Supplies needed: Blood pressure monitor. Dining room chair to sit in. Table or desk. Small notebook and pencil or pen. How to prepare To get the most accurate reading, avoid the following for 30 minutes before you check your blood pressure: Drinking caffeine. Drinking alcohol. Eating. Smoking. Exercising. Five minutes before you check your blood pressure: Use the bathroom and urinate  so that you have an empty bladder. Sit quietly in a dining room chair. Do not sit in a soft couch or an armchair. Do not talk. How to take your blood pressure To check your blood pressure, follow the instructions in the manual that came with your blood pressure monitor. If you have a digital blood pressure monitor, the instructions may be as follows: Sit up straight in a chair. Place your feet on the floor. Do not cross your ankles or legs. Rest your left arm at the level of your heart on a table or desk or on the arm of a chair. Pull up your shirt sleeve. Wrap the blood pressure cuff around the upper part of your left arm, 1 inch (2.5 cm) above your elbow. It is best to wrap the cuff around bare skin. Fit the cuff snugly around your arm. You should be able to place only one finger between the cuff and your arm. Position the cord so that it rests in the bend of your elbow. Press the power button. Sit quietly while the cuff inflates and deflates. Read the digital reading on the monitor screen and write the numbers down (record them) in a notebook. Wait 2-3 minutes, then repeat the steps, starting at step 1. What does my blood pressure reading mean? A blood pressure reading consists of a higher number over a lower number. Ideally, your blood pressure should be below 120/80. The first ("top") number is called the systolic pressure. It is a measure of the pressure in your arteries as your heart beats. The second ("bottom") number is called the diastolic pressure. It is a measure of the pressure in your arteries as theheart relaxes. Blood pressure is classified into five stages. The following are the stages for adults who do not have a short-term serious illness or a chronic condition. Systolic pressure and diastolic pressure are measured in a unit called mm Hg (millimeters of mercury). Normal Systolic pressure: below 120. Diastolic pressure: below 80. Elevated Systolic pressure: 120-129. Diastolic  pressure: below 80. Hypertension stage 1 Systolic pressure: 130-139. Diastolic pressure: 80-89. Hypertension stage 2 Systolic pressure: 140 or above. Diastolic pressure: 90 or above. You can have elevated blood pressure or hypertension even if only the systolicor only the diastolic number in your reading is higher than normal. Follow these instructions at home: Medicines Take over-the-counter and prescription medicines only as told by your health care provider. Tell your health care provider if you are having any side effects from blood pressure medicine. General instructions Check your blood pressure as often as recommended by your health care provider. Check your blood pressure at the same time every day. Take your monitor to the next appointment with your health care provider to make sure that: You are using it correctly. It provides accurate readings. Understand what your goal blood pressure numbers  are. Keep all follow-up visits as told by your health care provider. This is important. General tips Your health care provider can suggest a reliable monitor that will meet your needs. There are several types of home blood pressure monitors. Choose a monitor that has an arm cuff. Do not choose a monitor that measures your blood pressure from your wrist or finger. Choose a cuff that wraps snugly around your upper arm. You should be able to fit only one finger between your arm and the cuff. You can buy a blood pressure monitor at most drugstores or online. Where to find more information American Heart Association: www.heart.org Contact a health care provider if: Your blood pressure is consistently high. Your blood pressure is suddenly low. Get help right away if: Your systolic blood pressure is higher than 180. Your diastolic blood pressure is higher than 120. Summary Blood pressure is a measurement of how strongly your blood is pressing against the walls of your arteries. A blood  pressure reading consists of a higher number over a lower number. Ideally, your blood pressure should be below 120/80. Check your blood pressure at the same time every day. Avoid caffeine, alcohol, smoking, and exercise for 30 minutes prior to checking your blood pressure. These agents can affect the accuracy of the blood pressure reading. This information is not intended to replace advice given to you by your health care provider. Make sure you discuss any questions you have with your healthcare provider. Document Revised: 10/25/2020 Document Reviewed: 12/10/2019 Elsevier Patient Education  2022 [employer].

## 2021-06-20 NOTE — Progress Notes (Signed)
Patient presents with a gout flare up occurring Monday. Patient reports gout flare up in great toe on left foot. Patient has not eaten and took aleve.

## 2021-06-21 DIAGNOSIS — M109 Gout, unspecified: Secondary | ICD-10-CM | POA: Insufficient documentation

## 2021-06-21 DIAGNOSIS — E6609 Other obesity due to excess calories: Secondary | ICD-10-CM | POA: Insufficient documentation

## 2021-06-21 DIAGNOSIS — R030 Elevated blood-pressure reading, without diagnosis of hypertension: Secondary | ICD-10-CM | POA: Insufficient documentation

## 2021-07-04 ENCOUNTER — Encounter (INDEPENDENT_AMBULATORY_CARE_PROVIDER_SITE_OTHER): Payer: Self-pay | Admitting: Primary Care

## 2021-07-04 ENCOUNTER — Ambulatory Visit (INDEPENDENT_AMBULATORY_CARE_PROVIDER_SITE_OTHER): Payer: BLUE CROSS/BLUE SHIELD | Admitting: Primary Care

## 2021-07-04 ENCOUNTER — Other Ambulatory Visit: Payer: Self-pay

## 2021-07-04 VITALS — BP 122/84 | HR 72 | Temp 97.9°F | Ht 73.0 in | Wt 242.4 lb

## 2021-07-04 DIAGNOSIS — E6609 Other obesity due to excess calories: Secondary | ICD-10-CM | POA: Diagnosis not present

## 2021-07-04 DIAGNOSIS — Z6832 Body mass index (BMI) 32.0-32.9, adult: Secondary | ICD-10-CM

## 2021-07-04 DIAGNOSIS — M109 Gout, unspecified: Secondary | ICD-10-CM | POA: Diagnosis not present

## 2021-07-04 NOTE — Progress Notes (Signed)
Subjective:     [name] is a 25 y.o. obese male who presents with gout he is experiencing acute approximately every 3 -4 months despite being on colchicine  Pain is located in  left big toe and has been present for resolved attacks .  The following portions of the patient's history were reviewed and updated as appropriate: allergies, current medications, past family history, past medical history, past social history, past surgical history, and problem list.  Review of Systems A comprehensive review of systems was negative.    Objective:    BP 122/84 (BP Location: Right Arm, Patient Position: Sitting, Cuff Size: Large)   Pulse 72   Temp 97.9 F (36.6 C) (Temporal)   Ht 6\' 1"  (1.854 m)   Wt 242 lb 6.4 oz (110 kg)   SpO2 95%   BMI 31.98 kg/m   Vitals:   07/04/21 1412 07/04/21 1428  BP: (!) 144/95 122/84  Pulse: 74 72  Temp: 97.9 F (36.6 C)   TempSrc: Temporal   SpO2: 95%   Weight: 242 lb 6.4 oz (110 kg)   Height: 6\' 1"  (1.854 m)    General: Vital signs reviewed.  Patient is well-developed and well-nourished, in no acute distress and cooperative with exam.  Head: Normocephalic and atraumatic. Eyes: EOMI, conjunctivae normal, no scleral icterus.  Neck: Supple, normal ROM,  Cardiovascular: RRR, S1 normal, S2 normal, no murmurs, gallops, or rubs. Pulmonary/Chest: Clear to auscultation bilaterally, no wheezes, rales, or rhonchi. Abdominal: Soft, non-tender, non-distended, BS +, no masses Musculoskeletal: No joint deformities, erythema, or stiffness, ROM full and nontender. Extremities: No lower extremity edema bilaterally,  pulses symmetric and intact bilaterally. No cyanosis or clubbing. Neurological: A&O x3, Strength is normal  Skin: Warm, dry and intact. No rashes or erythema. Psychiatric: Normal mood and affect. speech and behavior is normal. Cognition and memory are normal.       Assessment:  Jeff Vega was seen today for gout.  Diagnoses and all orders for this  visit:  Gouty arthritis of left great toe   Plan:    Education about gout causes and treatment discussed. [place]chicine per medication orders.   Class 1 obesity due to excess calories without serious comorbidity with body mass index (BMI) of 32.0 to 32.9 in adult Obesity is 30-39 indicating an excess in caloric intake or underlining conditions. This may lead to other co-morbidities. Lifestyle modifications of diet and exercise may reduce obesity.     

## 2021-07-04 NOTE — Patient Instructions (Signed)
Gout  Gout is a condition that causes painful swelling of the joints. Gout is a type of inflammation of the joints (arthritis). This condition is caused by having too much uric acid in the body. Uric acid is a chemical that forms when the body breaks down substances called purines.Purines are important for building body proteins. When the body has too much uric acid, sharp crystals can form and build up inside the joints. This causes pain and swelling. Gout attacks can happen quickly and may be very painful (acute gout). Over time, the attacks can affect more joints and become more frequent (chronic gout). Gout can also cause uric acid to build up under the skin and inside thekidneys. What are the causes? This condition is caused by too much uric acid in your blood. This can happen because: Your kidneys do not remove enough uric acid from your blood. This is the most common cause. Your body makes too much uric acid. This can happen with some cancers and cancer treatments. It can also occur if your body is breaking down too many red blood cells (hemolytic anemia). You eat too many foods that are high in purines. These foods include organ meats and some seafood. Alcohol, especially beer, is also high in purines. A gout attack may be triggered by trauma or stress. What increases the risk? You are more likely to develop this condition if you: Have a family history of gout. Are male and middle-aged. Are male and have gone through menopause. Are obese. Frequently drink alcohol, especially beer. Are dehydrated. Lose weight too quickly. Have an organ transplant. Have lead poisoning. Take certain medicines, including aspirin, cyclosporine, diuretics, levodopa, and niacin. Have kidney disease. Have a skin condition called psoriasis. What are the signs or symptoms? An attack of acute gout happens quickly. It usually occurs in just one joint. The most common place is the big toe. Attacks often start  at night. Other joints that may be affected include joints of the feet, ankle, knee, fingers, wrist, or elbow. Symptoms of this condition may include: Severe pain. Warmth. Swelling. Stiffness. Tenderness. The affected joint may be very painful to touch. Shiny, red, or purple skin. Chills and fever. Chronic gout may cause symptoms more frequently. More joints may be involved. You may also have white or yellow lumps (tophi) on your hands or feet or in other areas near your joints. How is this diagnosed? This condition is diagnosed based on your symptoms, medical history, and physical exam. You may have tests, such as: Blood tests to measure uric acid levels. Removal of joint fluid with a thin needle (aspiration) to look for uric acid crystals. X-rays to look for joint damage. How is this treated? Treatment for this condition has two phases: treating an acute attack and preventing future attacks. Acute gout treatment may include medicines to reduce pain and swelling, including: NSAIDs. Steroids. These are strong anti-inflammatory medicines that can be taken by mouth (orally) or injected into a joint. Colchicine. This medicine relieves pain and swelling when it is taken soon after an attack. It can be given by mouth or through an IV. Preventive treatment may include: Daily use of smaller doses of NSAIDs or colchicine. Use of a medicine that reduces uric acid levels in your blood. Changes to your diet. You may need to see a dietitian about what to eat and drink to prevent gout. Follow these instructions at home: During a gout attack  If directed, put ice on the affected area: Put   ice in a plastic bag. Place a towel between your skin and the bag. Leave the ice on for 20 minutes, 2-3 times a day. Raise (elevate) the affected joint above the level of your heart as often as possible. Rest the joint as much as possible. If the affected joint is in your leg, you may be given crutches to  use. Follow instructions from your health care provider about eating or drinking restrictions.  Avoiding future gout attacks Follow a low-purine diet as told by your dietitian or health care provider. Avoid foods and drinks that are high in purines, including liver, kidney, anchovies, asparagus, herring, mushrooms, mussels, and beer. Maintain a healthy weight or lose weight if you are overweight. If you want to lose weight, talk with your health care provider. It is important that you do not lose weight too quickly. Start or maintain an exercise program as told by your health care provider. Eating and drinking Drink enough fluids to keep your urine pale yellow. If you drink alcohol: Limit how much you use to: 0-1 drink a day for women. 0-2 drinks a day for men. Be aware of how much alcohol is in your drink. In the [place] one drink equals one 12 oz bottle of beer (355 mL) one 5 oz glass of wine (148 mL), or one 1 oz glass of hard liquor (44 mL). General instructions Take over-the-counter and prescription medicines only as told by your health care provider. Do not drive or use heavy machinery while taking prescription pain medicine. Return to your normal activities as told by your health care provider. Ask your health care provider what activities are safe for you. Keep all follow-up visits as told by your health care provider. This is important. Contact a health care provider if you have: Another gout attack. Continuing symptoms of a gout attack after 10 days of treatment. Side effects from your medicines. Chills or a fever. Burning pain when you urinate. Pain in your lower back or belly. Get help right away if you: Have severe or uncontrolled pain. Cannot urinate. Summary Gout is painful swelling of the joints caused by inflammation. The most common site of pain is the big toe, but it can affect other joints in the body. Medicines and dietary changes can help to prevent and treat gout  attacks. This information is not intended to replace advice given to you by your health care provider. Make sure you discuss any questions you have with your healthcare provider. Document Revised: 07/08/2018 Document Reviewed: 07/08/2018 Elsevier Patient Education  2022 Elsevier Inc.  
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
## 2022-10-26 ENCOUNTER — Telehealth: Payer: Self-pay | Admitting: Family

## 2022-10-26 ENCOUNTER — Ambulatory Visit: Payer: Self-pay

## 2022-10-26 DIAGNOSIS — M109 Gout, unspecified: Secondary | ICD-10-CM

## 2022-10-26 MED ORDER — IBUPROFEN 600 MG PO TABS: 600.0000 mg | ORAL_TABLET | Freq: Three times a day (TID) | ORAL | 0 refills | Status: AC | PRN

## 2022-10-26 MED ORDER — COLCHICINE 0.6 MG PO TABS: ORAL_TABLET | ORAL | 1 refills | Status: DC

## 2022-10-26 NOTE — Progress Notes (Signed)
Virtual Visit Consent   Jeff Vega, you are scheduled for a virtual visit with a [facility] provider today. Just as with appointments in the office, your consent must be obtained to participate. Your consent will be active for this visit and any virtual visit you may have with one of our providers in the next 365 days. If you have a MyChart account, a copy of this consent can be sent to you electronically.  As this is a virtual visit, video technology does not allow for your provider to perform a traditional examination. This may limit your provider's ability to fully assess your condition. If your provider identifies any concerns that need to be evaluated in person or the need to arrange testing (such as labs, EKG, etc.), we will make arrangements to do so. Although advances in technology are sophisticated, we cannot ensure that it will always work on either your end or our end. If the connection with a video visit is poor, the visit may have to be switched to a telephone visit. With either a video or telephone visit, we are not always able to ensure that we have a secure connection.  By engaging in this virtual visit, you consent to the provision of healthcare and authorize for your insurance to be billed (if applicable) for the services provided during this visit. Depending on your insurance coverage, you may receive a charge related to this service.  I need to obtain your verbal consent now. Are you willing to proceed with your visit today? Jeff Vega has provided verbal consent on 10/26/2022 for a virtual visit (video or telephone). Jannifer Rodney, FNP  Date: 10/26/2022 9:57 AM  Virtual Visit via Video Note   I, Jannifer Rodney, connected with  Jeff Vega  ([id number], 28/06/1996) on 10/26/22 at 10:00 AM EDT by a video-enabled telemedicine application and verified that I am speaking with the correct person using two identifiers.  Location: Patient: Virtual Visit Location Patient:  Home Provider: Virtual Visit Location Provider: Home Office   I discussed the limitations of evaluation and management by telemedicine and the availability of in person appointments. The patient expressed understanding and agreed to proceed.    History of Present Illness: Jeff Vega is a 26 y.o. who identifies as a male who was assigned male at birth, and is being seen today for gout flare up. He reports his left great toe swelling, pain, warmth that started two days ago and has worsen. He reports he has hx of gout. He has taken allopurinol daily, but has not been taking regularly.   He has taken tylenol with mild relief. Reports his pain is 9 out 10 that is worse when touching or walking.   HPI: HPI  Problems:  Patient Active Problem List   Diagnosis Date Noted   Gouty arthritis of left great toe 06/21/2021   Elevated blood pressure reading in office without diagnosis of hypertension 06/21/2021   Class 1 obesity due to excess calories with body mass index (BMI) of 32.0 to 32.9 in adult 06/21/2021    Allergies: No Known Allergies Medications:  Current Outpatient Medications:    ibuprofen (ADVIL) 600 MG tablet, Take 1 tablet (600 mg total) by mouth every 8 (eight) hours as needed., Disp: 30 tablet, Rfl: 0   allopurinol (ZYLOPRIM) 300 MG tablet, Take 1 tablet (300 mg total) by mouth daily. (Patient not taking: No sig reported), Disp: 90 tablet, Rfl: 1   cetirizine (ZYRTEC) 10 MG tablet, Take 1 tablet (10 mg total)  by mouth daily. (Patient not taking: Reported on 07/04/2021), Disp: 30 tablet, Rfl: 11   colchicine 0.6 MG tablet, Take two tablets (1.3 mg) at the onset of a gout attack and may repeat 1 tab (0.6 mg) after 1 hour if symptoms persist., Disp: 30 tablet, Rfl: 1  Observations/Objective: Patient is well-developed, well-nourished in no acute distress.  Resting comfortably  at home.  Head is normocephalic, atraumatic.  No labored breathing.  Speech is clear and coherent with logical  content.  Patient is alert and oriented at baseline.  Left great toe mildly swollen   Assessment and Plan: 1. Acute gout involving toe of left foot, unspecified cause - colchicine 0.6 MG tablet; Take two tablets (1.3 mg) at the onset of a gout attack and may repeat 1 tab (0.6 mg) after 1 hour if symptoms persist.  Dispense: 30 tablet; Refill: 1 - ibuprofen (ADVIL) 600 MG tablet; Take 1 tablet (600 mg total) by mouth every 8 (eight) hours as needed.  Dispense: 30 tablet; Refill: 0  2. Gouty arthritis of left great toe - colchicine 0.6 MG tablet; Take two tablets (1.3 mg) at the onset of a gout attack and may repeat 1 tab (0.6 mg) after 1 hour if symptoms persist.  Dispense: 30 tablet; Refill: 1  Start colchicine  Motrin as needed Force fluids Low purine diet Follow up if symptoms worsen or do not improve   Follow Up Instructions: I discussed the assessment and treatment plan with the patient. The patient was provided an opportunity to ask questions and all were answered. The patient agreed with the plan and demonstrated an understanding of the instructions.  A copy of instructions were sent to the patient via MyChart unless otherwise noted below.     The patient was advised to call back or seek an in-person evaluation if the symptoms worsen or if the condition fails to improve as anticipated.  Time:  I spent 6 minutes with the patient via telehealth technology discussing the above problems/concerns.    Evelina Dun, FNP

## 2022-10-26 NOTE — Patient Instructions (Signed)
Gout  Gout is a condition that causes painful swelling of the joints. Gout is a type of inflammation of the joints (arthritis). This condition is caused by having too much uric acid in the body. Uric acid is a chemical that forms when the body breaks down substances called purines. Purines are important for building body proteins. When the body has too much uric acid, sharp crystals can form and build up inside the joints. This causes pain and swelling. Gout attacks can happen quickly and may be very painful (acute gout). Over time, the attacks can affect more joints and become more frequent (chronic gout). Gout can also cause uric acid to build up under the skin and inside the kidneys. What are the causes? This condition is caused by too much uric acid in your blood. This can happen because: Your kidneys do not remove enough uric acid from your blood. This is the most common cause. Your body makes too much uric acid. This can happen with some cancers and cancer treatments. It can also occur if your body is breaking down too many red blood cells (hemolytic anemia). You eat too many foods that are high in purines. These foods include organ meats and some seafood. Alcohol, especially beer, is also high in purines. A gout attack may be triggered by trauma or stress. What increases the risk? The following factors may make you more likely to develop this condition: Having a family history of gout. Being male and middle-aged. Being male and having gone through menopause. Taking certain medicines, including aspirin, cyclosporine, diuretics, levodopa, and niacin. Having an organ transplant. Having certain conditions, such as: Being obese. Lead poisoning. Kidney disease. A skin condition called psoriasis. Other factors include: Losing weight too quickly. Being dehydrated. Frequently drinking alcohol, especially beer. Frequently drinking beverages that are sweetened with a type of sugar called  fructose. What are the signs or symptoms? An attack of acute gout happens quickly. It usually occurs in just one joint. The most common place is the big toe. Attacks often start at night. Other joints that may be affected include joints of the feet, ankle, knee, fingers, wrist, or elbow. Symptoms of this condition may include: Severe pain. Warmth. Swelling. Stiffness. Tenderness. The affected joint may be very painful to touch. Shiny, red, or purple skin. Chills and fever. Chronic gout may cause symptoms more frequently. More joints may be involved. You may also have white or yellow lumps (tophi) on your hands or feet or in other areas near your joints. How is this diagnosed? This condition is diagnosed based on your symptoms, your medical history, and a physical exam. You may have tests, such as: Blood tests to measure uric acid levels. Removal of joint fluid with a thin needle (aspiration) to look for uric acid crystals. X-rays to look for joint damage. How is this treated? Treatment for this condition has two phases: treating an acute attack and preventing future attacks. Acute gout treatment may include medicines to reduce pain and swelling, including: NSAIDs, such as ibuprofen. Steroids. These are strong anti-inflammatory medicines that can be taken by mouth (orally) or injected into a joint. Colchicine. This medicine relieves pain and swelling when it is taken soon after an attack. It can be given by mouth or through an IV. Preventive treatment may include: Daily use of smaller doses of NSAIDs or colchicine. Use of a medicine that reduces uric acid levels in your blood, such as allopurinol. Changes to your diet. You may need to see   a dietitian about what to eat and drink to prevent gout. Follow these instructions at home: During a gout attack  If directed, put ice on the affected area. To do this: Put ice in a plastic bag. Place a towel between your skin and the bag. Leave the  ice on for 20 minutes, 2-3 times a day. Remove the ice if your skin turns bright red. This is very important. If you cannot feel pain, heat, or cold, you have a greater risk of damage to the area. Raise (elevate) the affected joint above the level of your heart as often as possible. Rest the joint as much as possible. If the affected joint is in your leg, you may be given crutches to use. Follow instructions from your health care provider about eating or drinking restrictions. Avoiding future gout attacks Follow a low-purine diet as told by your dietitian or health care provider. Avoid foods and drinks that are high in purines, including liver, kidney, anchovies, asparagus, herring, mushrooms, mussels, and beer. Maintain a healthy weight or lose weight if you are overweight. If you want to lose weight, talk with your health care provider. Do not lose weight too quickly. Start or maintain an exercise program as told by your health care provider. Eating and drinking Avoid drinking beverages that contain fructose. Drink enough fluids to keep your urine pale yellow. If you drink alcohol: Limit how much you have to: 0-1 drink a day for women who are not pregnant. 0-2 drinks a day for men. Know how much alcohol is in a drink. In the [place] one drink equals one 12 oz bottle of beer (355 mL), one 5 oz glass of wine (148 mL), or one 1 oz glass of hard liquor (44 mL). General instructions Take over-the-counter and prescription medicines only as told by your health care provider. Ask your health care provider if the medicine prescribed to you requires you to avoid driving or using machinery. Return to your normal activities as told by your health care provider. Ask your health care provider what activities are safe for you. Keep all follow-up visits. This is important. Where to find more information [employer]: www.niams.nih.gov Contact a health care provider if you have: Another  gout attack. Continuing symptoms of a gout attack after 10 days of treatment. Side effects from your medicines. Chills or a fever. Burning pain when you urinate. Pain in your lower back or abdomen. Get help right away if you: Have severe or uncontrolled pain. Cannot urinate. Summary Gout is painful swelling of the joints caused by having too much uric acid in the body. The most common site for gout to occur is in the big toe, but it can affect other joints in the body. Medicines and dietary changes can help to prevent and treat gout attacks. This information is not intended to replace advice given to you by your health care provider. Make sure you discuss any questions you have with your health care provider. Document Revised: 09/19/2021 Document Reviewed: 09/19/2021 Elsevier Patient Education  2023 Elsevier Inc.  

## 2023-10-24 ENCOUNTER — Telehealth: Payer: BC Managed Care – PPO

## 2023-10-24 ENCOUNTER — Telehealth: Payer: BC Managed Care – PPO | Admitting: Physician Assistant

## 2023-10-24 ENCOUNTER — Other Ambulatory Visit: Payer: Self-pay | Admitting: Family

## 2023-10-24 DIAGNOSIS — M10071 Idiopathic gout, right ankle and foot: Secondary | ICD-10-CM | POA: Diagnosis not present

## 2023-10-24 DIAGNOSIS — M109 Gout, unspecified: Secondary | ICD-10-CM

## 2023-10-24 MED ORDER — COLCHICINE 0.6 MG PO TABS: ORAL_TABLET | ORAL | 1 refills | Status: DC

## 2023-10-24 NOTE — Patient Instructions (Signed)
[name], thank you for joining Margaretann Loveless, PA-C for today's virtual visit.  While this provider is not your primary care provider (PCP), if your PCP is located in our provider database this encounter information will be shared with them immediately following your visit.   A [facility] MyChart account gives you access to today's visit and all your visits, tests, and labs performed at [facility] " click here if you don't have a [facility] MyChart account or go to [email or web address]  Consent: (Patient) Jeff Vega provided verbal consent for this virtual visit at the beginning of the encounter.  Current Medications:  Current Outpatient Medications:    allopurinol (ZYLOPRIM) 300 MG tablet, Take 1 tablet (300 mg total) by mouth daily. (Patient not taking: No sig reported), Disp: 90 tablet, Rfl: 1   cetirizine (ZYRTEC) 10 MG tablet, Take 1 tablet (10 mg total) by mouth daily. (Patient not taking: Reported on 07/04/2021), Disp: 30 tablet, Rfl: 11   colchicine 0.6 MG tablet, Take two tablets (1.2 mg) at the onset of a gout attack and may repeat 1 tab (0.6 mg) after 1 hour if symptoms persist., Disp: 30 tablet, Rfl: 1   ibuprofen (ADVIL) 600 MG tablet, Take 1 tablet (600 mg total) by mouth every 8 (eight) hours as needed., Disp: 30 tablet, Rfl: 0   Medications ordered in this encounter:  Meds ordered this encounter  Medications   colchicine 0.6 MG tablet    Sig: Take two tablets (1.2 mg) at the onset of a gout attack and may repeat 1 tab (0.6 mg) after 1 hour if symptoms persist.    Dispense:  30 tablet    Refill:  1    Order Specific Question:   Supervising Provider    Answer:   Merrilee Jansky [id number]     *If you need refills on other medications prior to your next appointment, please contact your pharmacy*  Follow-Up: Call back or seek an in-person evaluation if the symptoms worsen or if the condition fails to improve as anticipated.  [facility] [phone]  Other Instructions Gout  Gout is a condition that causes painful swelling of the joints. Gout is a type of inflammation of the joints (arthritis). This condition is caused by having too much uric acid in the body. Uric acid is a chemical that forms when the body breaks down substances called purines. Purines are important for building body proteins. When the body has too much uric acid, sharp crystals can form and build up inside the joints. This causes pain and swelling. Gout attacks can happen quickly and may be very painful (acute gout). Over time, the attacks can affect more joints and become more frequent (chronic gout). Gout can also cause uric acid to build up under the skin and inside the kidneys. What are the causes? This condition is caused by too much uric acid in your blood. This can happen because: Your kidneys do not remove enough uric acid from your blood. This is the most common cause. Your body makes too much uric acid. This can happen with some cancers and cancer treatments. It can also occur if your body is breaking down too many red blood cells (hemolytic anemia). You eat too many foods that are high in purines. These foods include organ meats and some seafood. Alcohol, especially beer, is also high in purines. A gout attack may be triggered by trauma or stress. What increases the risk? The following factors may  make you more likely to develop this condition: Having a family history of gout. Being male and middle-aged. Being male and having gone through menopause. Taking certain medicines, including aspirin, cyclosporine, diuretics, levodopa, and niacin. Having an organ transplant. Having certain conditions, such as: Being obese. Lead poisoning. Kidney disease. A skin condition called psoriasis. Other factors include: Losing weight too quickly. Being dehydrated. Frequently drinking alcohol, especially beer. Frequently drinking beverages  that are sweetened with a type of sugar called fructose. What are the signs or symptoms? An attack of acute gout happens quickly. It usually occurs in just one joint. The most common place is the big toe. Attacks often start at night. Other joints that may be affected include joints of the feet, ankle, knee, fingers, wrist, or elbow. Symptoms of this condition may include: Severe pain. Warmth. Swelling. Stiffness. Tenderness. The affected joint may be very painful to touch. Shiny, red, or purple skin. Chills and fever. Chronic gout may cause symptoms more frequently. More joints may be involved. You may also have white or yellow lumps (tophi) on your hands or feet or in other areas near your joints. How is this diagnosed? This condition is diagnosed based on your symptoms, your medical history, and a physical exam. You may have tests, such as: Blood tests to measure uric acid levels. Removal of joint fluid with a thin needle (aspiration) to look for uric acid crystals. X-rays to look for joint damage. How is this treated? Treatment for this condition has two phases: treating an acute attack and preventing future attacks. Acute gout treatment may include medicines to reduce pain and swelling, including: NSAIDs, such as ibuprofen. Steroids. These are strong anti-inflammatory medicines that can be taken by mouth (orally) or injected into a joint. Colchicine. This medicine relieves pain and swelling when it is taken soon after an attack. It can be given by mouth or through an IV. Preventive treatment may include: Daily use of smaller doses of NSAIDs or colchicine. Use of a medicine that reduces uric acid levels in your blood, such as allopurinol. Changes to your diet. You may need to see a dietitian about what to eat and drink to prevent gout. Follow these instructions at home: During a gout attack  If directed, put ice on the affected area. To do this: Put ice in a plastic bag. Place a  towel between your skin and the bag. Leave the ice on for 20 minutes, 2-3 times a day. Remove the ice if your skin turns bright red. This is very important. If you cannot feel pain, heat, or cold, you have a greater risk of damage to the area. Raise (elevate) the affected joint above the level of your heart as often as possible. Rest the joint as much as possible. If the affected joint is in your leg, you may be given crutches to use. Follow instructions from your health care provider about eating or drinking restrictions. Avoiding future gout attacks Follow a low-purine diet as told by your dietitian or health care provider. Avoid foods and drinks that are high in purines, including liver, kidney, anchovies, asparagus, herring, mushrooms, mussels, and beer. Maintain a healthy weight or lose weight if you are overweight. If you want to lose weight, talk with your health care provider. Do not lose weight too quickly. Start or maintain an exercise program as told by your health care provider. Eating and drinking Avoid drinking beverages that contain fructose. Drink enough fluids to keep your urine pale yellow. If you  drink alcohol: Limit how much you have to: 0-1 drink a day for women who are not pregnant. 0-2 drinks a day for men. Know how much alcohol is in a drink. In the [place] one drink equals one 12 oz bottle of beer (355 mL), one 5 oz glass of wine (148 mL), or one 1 oz glass of hard liquor (44 mL). General instructions Take over-the-counter and prescription medicines only as told by your health care provider. Ask your health care provider if the medicine prescribed to you requires you to avoid driving or using machinery. Return to your normal activities as told by your health care provider. Ask your health care provider what activities are safe for you. Keep all follow-up visits. This is important. Where to find more information [employer]: www.niams.http://www.myers.net/ Contact  a health care provider if you have: Another gout attack. Continuing symptoms of a gout attack after 10 days of treatment. Side effects from your medicines. Chills or a fever. Burning pain when you urinate. Pain in your lower back or abdomen. Get help right away if you: Have severe or uncontrolled pain. Cannot urinate. Summary Gout is painful swelling of the joints caused by having too much uric acid in the body. The most common site for gout to occur is in the big toe, but it can affect other joints in the body. Medicines and dietary changes can help to prevent and treat gout attacks. This information is not intended to replace advice given to you by your health care provider. Make sure you discuss any questions you have with your health care provider. Document Revised: 09/19/2021 Document Reviewed: 09/19/2021 Elsevier Patient Education  2024 Elsevier Inc.    If you have been instructed to have an in-person evaluation today at a local Urgent Care facility, please use the link below. It will take you to a list of all of our available [facility], including address, phone number and hours of operation. Please do not delay care.  [facility]  If you or a family member do not have a primary care provider, use the link below to schedule a visit and establish care. When you choose a [facility] primary care physician or advanced practice provider, you gain a long-term partner in health. Find a Primary Care Provider  Learn more about [facility]'s in-office and virtual care options: [facility] - Get Care Now

## 2023-10-24 NOTE — Progress Notes (Signed)
Virtual Visit Consent   Jeff Vega, you are scheduled for a virtual visit with a  provider today. Just as with appointments in the office, your consent must be obtained to participate. Your consent will be active for this visit and any virtual visit you may have with one of our providers in the next 365 days. If you have a MyChart account, a copy of this consent can be sent to you electronically.  As this is a virtual visit, video technology does not allow for your provider to perform a traditional examination. This may limit your provider's ability to fully assess your condition. If your provider identifies any concerns that need to be evaluated in person or the need to arrange testing (such as labs, EKG, etc.), we will make arrangements to do so. Although advances in technology are sophisticated, we cannot ensure that it will always work on either your end or our end. If the connection with a video visit is poor, the visit may have to be switched to a telephone visit. With either a video or telephone visit, we are not always able to ensure that we have a secure connection.  By engaging in this virtual visit, you consent to the provision of healthcare and authorize for your insurance to be billed (if applicable) for the services provided during this visit. Depending on your insurance coverage, you may receive a charge related to this service.  I need to obtain your verbal consent now. Are you willing to proceed with your visit today? Jeff Vega has provided verbal consent on 10/24/2023 for a virtual visit (video or telephone). Margaretann Loveless, PA-C  Date: 10/24/2023 9:21 AM  Virtual Visit via Video Note   I, Margaretann Loveless, connected with  Jeff Vega  ([id number], 1996/05/16) on 10/24/23 at  9:15 AM EDT by a video-enabled telemedicine application and verified that I am speaking with the correct person using two identifiers.  Location: Patient: Virtual Visit Location  Patient: Home Provider: Virtual Visit Location Provider: Home Office   I discussed the limitations of evaluation and management by telemedicine and the availability of in person appointments. The patient expressed understanding and agreed to proceed.    History of Present Illness: Jeff Vega is a 27 y.o. who identifies as a male who was assigned male at birth, and is being seen today for gout flare. Diagnosed initially 2022. Last flare was 10/26/22. This flare started last night. It is in the right great toe. It is in the 1st MTP joint. It is painful, tender to touch, red, swollen and warm. Denies fevers, chills, nausea, vomiting.  Problems:  Patient Active Problem List   Diagnosis Date Noted   Gouty arthritis of left great toe 06/21/2021   Elevated blood pressure reading in office without diagnosis of hypertension 06/21/2021   Class 1 obesity due to excess calories with body mass index (BMI) of 32.0 to 32.9 in adult 06/21/2021    Allergies: No Known Allergies Medications:  Current Outpatient Medications:    allopurinol (ZYLOPRIM) 300 MG tablet, Take 1 tablet (300 mg total) by mouth daily. (Patient not taking: No sig reported), Disp: 90 tablet, Rfl: 1   cetirizine (ZYRTEC) 10 MG tablet, Take 1 tablet (10 mg total) by mouth daily. (Patient not taking: Reported on 07/04/2021), Disp: 30 tablet, Rfl: 11   colchicine 0.6 MG tablet, Take two tablets (1.2 mg) at the onset of a gout attack and may repeat 1 tab (0.6 mg) after 1 hour if symptoms persist.,  Disp: 30 tablet, Rfl: 1   ibuprofen (ADVIL) 600 MG tablet, Take 1 tablet (600 mg total) by mouth every 8 (eight) hours as needed., Disp: 30 tablet, Rfl: 0  Observations/Objective: Patient is well-developed, well-nourished in no acute distress.  Resting comfortably at home.  Head is normocephalic, atraumatic.  No labored breathing.  Speech is clear and coherent with logical content.  Patient is alert and oriented at baseline.    Assessment and  Plan: 1. Acute idiopathic gout involving toe of right foot - colchicine 0.6 MG tablet; Take two tablets (1.2 mg) at the onset of a gout attack and may repeat 1 tab (0.6 mg) after 1 hour if symptoms persist.  Dispense: 30 tablet; Refill: 1  - Acute gout flare - Colchicine refilled - Ice as needed - Elevate - Push fluids - Seek in person evaluation if not improving or if worsening  Follow Up Instructions: I discussed the assessment and treatment plan with the patient. The patient was provided an opportunity to ask questions and all were answered. The patient agreed with the plan and demonstrated an understanding of the instructions.  A copy of instructions were sent to the patient via MyChart unless otherwise noted below.    The patient was advised to call back or seek an in-person evaluation if the symptoms worsen or if the condition fails to improve as anticipated.    Margaretann Loveless, PA-C

## 2024-01-12 ENCOUNTER — Telehealth (INDEPENDENT_AMBULATORY_CARE_PROVIDER_SITE_OTHER): Payer: Self-pay

## 2024-01-12 NOTE — Telephone Encounter (Signed)
 Returned pt call. Pt states he has a virtual appt schedule with provider for 1/17 for LOA paperwork to be filled out. Made pt aware that he will need to see provider in office because it has been 2 years since he has seen Rosaline and he is considered a new patient. Pt states he understands  Pt then asked what paperwork is he needing for provider to fill out. Made pt aware that he needs to contact his human resources department and make them aware of his situation and they will provide him with the necessary paperwork. Pt states he understands  Pt appt has been changed to in person for 1/17

## 2024-01-12 NOTE — Telephone Encounter (Signed)
 Copied from CRM [id number]. Topic: General - Other >> Jan 12, 2024  9:06 AM Phill Myron wrote: Please call [name] regarding Leave of Absence paperwork  and a sooner appt. Please advise

## 2024-01-16 ENCOUNTER — Ambulatory Visit (INDEPENDENT_AMBULATORY_CARE_PROVIDER_SITE_OTHER): Payer: BC Managed Care – PPO | Admitting: Primary Care

## 2024-02-17 ENCOUNTER — Telehealth (INDEPENDENT_AMBULATORY_CARE_PROVIDER_SITE_OTHER): Payer: Self-pay | Admitting: Primary Care

## 2024-02-17 NOTE — Telephone Encounter (Signed)
Pt called in with [facility] on the phone and was wanting to know if we were accepting new pts. I explained to pt that the next New pt atp was in April and I asked pt if he wanted that atp. He said he could not wait that long because he is needing to go back to work . Pt does not understand the policy of him not seeing provider in over two (2) YEARS THAT HE IS CONSIDERED A NEW PT. pT SAID IT DOES NOT MAKE SENSE AND WANTS TO KNOW WHAT HE CAN DO BECAUSE HE IS JUST GETTING THE THE POINT OF WALKING. I told pt that I would send a message to provider and I will follow back up with them.

## 2024-02-23 ENCOUNTER — Telehealth (INDEPENDENT_AMBULATORY_CARE_PROVIDER_SITE_OTHER): Payer: Self-pay | Admitting: Primary Care

## 2024-02-23 NOTE — Telephone Encounter (Signed)
 Called pt to remind them about atp. VM left with pt.

## 2024-03-01 ENCOUNTER — Ambulatory Visit (INDEPENDENT_AMBULATORY_CARE_PROVIDER_SITE_OTHER): Payer: BC Managed Care – PPO | Admitting: Primary Care

## 2024-03-01 ENCOUNTER — Encounter (INDEPENDENT_AMBULATORY_CARE_PROVIDER_SITE_OTHER): Payer: Self-pay | Admitting: Primary Care

## 2024-03-01 VITALS — BP 130/78 | HR 60 | Temp 98.2°F | Resp 12 | Ht 71.0 in | Wt 252.0 lb

## 2024-03-01 DIAGNOSIS — Z6835 Body mass index (BMI) 35.0-35.9, adult: Secondary | ICD-10-CM

## 2024-03-01 DIAGNOSIS — M10071 Idiopathic gout, right ankle and foot: Secondary | ICD-10-CM

## 2024-03-01 DIAGNOSIS — Z7689 Persons encountering health services in other specified circumstances: Secondary | ICD-10-CM

## 2024-03-01 DIAGNOSIS — E66811 Obesity, class 1: Secondary | ICD-10-CM

## 2024-03-01 DIAGNOSIS — E6609 Other obesity due to excess calories: Secondary | ICD-10-CM

## 2024-03-01 DIAGNOSIS — F4321 Adjustment disorder with depressed mood: Secondary | ICD-10-CM | POA: Diagnosis not present

## 2024-03-01 DIAGNOSIS — E66812 Obesity, class 2: Secondary | ICD-10-CM | POA: Diagnosis not present

## 2024-03-01 DIAGNOSIS — Z6832 Body mass index (BMI) 32.0-32.9, adult: Secondary | ICD-10-CM | POA: Diagnosis not present

## 2024-03-01 MED ORDER — ALLOPURINOL 300 MG PO TABS: 300.0000 mg | ORAL_TABLET | Freq: Every day | ORAL | 1 refills | Status: DC

## 2024-03-01 NOTE — Patient Instructions (Signed)
 Gout  Gout is painful swelling of your joints. Gout is a type of arthritis. It is caused by having too much uric acid in your body. Uric acid is a chemical that is made when your body breaks down substances called purines. If your body has too much uric acid, sharp crystals can form and build up in your joints. This causes pain and swelling. Gout attacks can happen quickly and be very painful (acute gout). Over time, the attacks can affect more joints and happen more often (chronic gout). What are the causes? Gout is caused by too much uric acid in your blood. This can happen because: Your kidneys do not remove enough uric acid from your blood. Your body makes too much uric acid. You eat too many foods that are high in purines. These foods include organ meats, some seafood, and beer. Trauma or stress can bring on an attack. What increases the risk? Having a family history of gout. Being male and middle-aged. Being male and having gone through menopause. Having an organ transplant. Taking certain medicines. Having certain conditions, such as: Being very overweight (obese). Lead poisoning. Kidney disease. A skin condition called psoriasis. Other risks include: Losing weight too quickly. Not having enough water in the body (being dehydrated). Drinking alcohol, especially beer. Drinking beverages that are sweetened with a type of sugar called fructose. What are the signs or symptoms? An attack of acute gout often starts at night and usually happens in just one joint. The most common place is the big toe. Other joints that may be affected include joints of the feet, ankle, knee, fingers, wrist, or elbow. Symptoms may include: Very bad pain. Warmth. Swelling. Stiffness. Tenderness. The affected joint may be very painful to touch. Shiny, red, or purple skin. Chills and fever. Chronic gout may cause symptoms more often. More joints may be involved. You may also have white or yellow lumps  (tophi) on your hands or feet or in other areas near your joints. How is this treated? Treatment for an acute attack may include medicines for pain and swelling, such as: NSAIDs, such as ibuprofen. Steroids taken by mouth or injected into a joint. Colchicine. This can be given by mouth or through an IV tube. Treatment to prevent future attacks may include: Taking small doses of NSAIDs or colchicine daily. Using a medicine that reduces uric acid levels in your blood, such as allopurinol. Making changes to your diet. You may need to see a food expert (dietitian) about what to eat and drink to prevent gout. Follow these instructions at home: During a gout attack  If told, put ice on the painful area. To do this: Put ice in a plastic bag. Place a towel between your skin and the bag. Leave the ice on for 20 minutes, 2-3 times a day. Take off the ice if your skin turns bright red. This is very important. If you cannot feel pain, heat, or cold, you have a greater risk of damage to the area. Raise the painful joint above the level of your heart as often as you can. Rest the joint as much as possible. If the joint is in your leg, you may be given crutches. Follow instructions from your doctor about what you cannot eat or drink. Avoiding future gout attacks Eat a low-purine diet. Avoid foods and drinks such as: Liver. Kidney. Anchovies. Asparagus. Herring. Mushrooms. Mussels. Beer. Stay at a healthy weight. If you want to lose weight, talk with your doctor. Do not  lose weight too fast. Start or continue an exercise plan as told by your doctor. Eating and drinking Avoid drinks sweetened by fructose. Drink enough fluids to keep your pee (urine) pale yellow. If you drink alcohol: Limit how much you have to: 0-1 drink a day for women who are not pregnant. 0-2 drinks a day for men. Know how much alcohol is in a drink. In the [place] one drink equals one 12 oz bottle of beer (355 mL), one 5 oz  glass of wine (148 mL), or one 1 oz glass of hard liquor (44 mL). General instructions Take over-the-counter and prescription medicines only as told by your doctor. Ask your doctor if you should avoid driving or using machines while you are taking your medicine. Return to your normal activities when your doctor says that it is safe. Keep all follow-up visits. Where to find more information [employer]: www.niams.http://www.myers.net/ Contact a doctor if: You have another gout attack. You still have symptoms of a gout attack after 10 days of treatment. You have problems (side effects) because of your medicines. You have chills or a fever. You have burning pain when you pee (urinate). You have pain in your lower back or belly. Get help right away if: You have very bad pain. Your pain cannot be controlled. You cannot pee. Summary Gout is painful swelling of the joints. The most common site of pain is the big toe, but it can affect other joints. Medicines and avoiding some foods can help to prevent and treat gout attacks. This information is not intended to replace advice given to you by your health care provider. Make sure you discuss any questions you have with your health care provider. Document Revised: 09/19/2021 Document Reviewed: 09/19/2021 Elsevier Patient Education  2024 [employer].

## 2024-03-01 NOTE — Progress Notes (Signed)
 New Patient Office Visit  Subjective    Patient ID: Jeff Vega male  DOB: [birth year]  Age: 28 y.o. MRN: [id number]   CC:  Reestablish care last visit 07/04/2021   HPI  Jeff Vega is a 28 year old obese male who presents to reestablish care.  He has had a gout flareup and decided he might need to follow-up with his previous provider.  He is aware of the foods that cause gout red meats, foods with purines, seafoods, pickle, and alcohol.  Just to mention a few. He also has been under a great deal of stress and depression his father passed March 2024 and his grandfather passed April 2024 and Aunt 2024.  February 2025 his father sister recently passed off breast cancer.  Does not want to take medication at this time but discussing the need for grieving or some type of therapy. Current Outpatient Medications on File Prior to Visit  Medication Sig Dispense Refill   colchicine 0.6 MG tablet Take two tablets (1.2 mg) at the onset of a gout attack and may repeat 1 tab (0.6 mg) after 1 hour if symptoms persist. 30 tablet 1   cetirizine (ZYRTEC) 10 MG tablet Take 1 tablet (10 mg total) by mouth daily. (Patient not taking: Reported on 03/01/2024) 30 tablet 11   ibuprofen (ADVIL) 600 MG tablet Take 1 tablet (600 mg total) by mouth every 8 (eight) hours as needed. (Patient not taking: Reported on 03/01/2024) 30 tablet 0   [DISCONTINUED] dicyclomine (BENTYL) 10 MG capsule Take 1 tab every 6 hours as needed for abdominal cramping. 60 capsule 0   No current facility-administered medications on file prior to visit.     No Known Allergies  History reviewed. No pertinent past medical history.   Past Surgical History:  Procedure Laterality Date   ADENOIDECTOMY     TONSILLECTOMY       Family History  Problem Relation Age of Onset   Diabetes Father    Healthy Mother     Social History   Socioeconomic History   Marital status: Single    Spouse name: Not on file   Number of children: Not on  file   Years of education: Not on file   Highest education level: Not on file  Occupational History   Not on file  Tobacco Use   Smoking status: Never   Smokeless tobacco: Never  Vaping Use   Vaping status: Never Used  Substance and Sexual Activity   Alcohol use: No   Drug use: No   Sexual activity: Not Currently  Other Topics Concern   Not on file  Social History Narrative   Not on file   Social Drivers of Health   Financial Resource Strain: Not on file  Food Insecurity: Not on file  Transportation Needs: Not on file  Physical Activity: Not on file  Stress: Not on file  Social Connections: Not on file  Intimate Partner Violence: Not on file   Health Maintenance  Topic Date Due   HIV Screening  Never done   Hepatitis C Screening  Never done   Flu Shot  Never done   COVID-19 Vaccine (1 - 2024-25 season) Never done   DTaP/Tdap/Td vaccine (2 - Td or Tdap) 06/26/2028   HPV Vaccine  Aged Out   Objective    BP 130/78 (BP Location: Left Arm, Patient Position: Sitting, Cuff Size: Normal)   Pulse 60   Temp 98.2 F (36.8 C) (Oral)   Resp 12  Ht 5\' 11"  (1.803 m)   Wt 252 lb (114.3 kg)   SpO2 99%   BMI 35.15 kg/m   Physical Exam Vitals reviewed.  Constitutional:      Appearance: He is obese.  HENT:     Head: Normocephalic.     Right Ear: Tympanic membrane and external ear normal.     Left Ear: Tympanic membrane and external ear normal.     Nose: Nose normal.  Eyes:     Extraocular Movements: Extraocular movements intact.     Pupils: Pupils are equal, round, and reactive to light.  Cardiovascular:     Rate and Rhythm: Normal rate and regular rhythm.  Pulmonary:     Effort: Pulmonary effort is normal.     Breath sounds: Normal breath sounds.  Abdominal:     General: Bowel sounds are normal.     Palpations: Abdomen is soft.  Musculoskeletal:        General: Normal range of motion.  Skin:    General: Skin is warm and dry.  Neurological:     Mental Status:  He is oriented to person, place, and time.  Psychiatric:        Mood and Affect: Mood normal.        Behavior: Behavior normal.        Thought Content: Thought content normal.        Judgment: Judgment normal.     Assessment & Plan:  Sullivan was seen today for establish care.  Diagnoses and all orders for this visit:  Encounter to establish care  Acute idiopathic gout involving toe of right foot Resolved but is in his right wrist and painful to use hand to lift anything.   Class 1 obesity due to excess calories without serious comorbidity with body mass index (BMI) of 32.0 to 32.9 in adult Obesity is 30-39 indicating an excess in caloric intake or underlining conditions. This may lead to other co-morbidities. Educated on lifestyle modifications of diet and exercise which may reduce obesity.    Grieving  See HPI Refer to LCSW intervention    Follow-up:  3 months  The above assessment and management plan was discussed with the patient. The patient verbalized understanding of and has agreed to the management plan. Patient is aware to call the clinic if symptoms fail to improve or worsen. Patient is aware when to return to the clinic for a follow-up visit. Patient educated on when it is appropriate to go to the emergency department.   Gwinda Passe, NP-C

## 2024-03-02 LAB — CBC WITH DIFFERENTIAL/PLATELET
Basophils Absolute: 0 10*3/uL (ref 0.0–0.2)
Basos: 1 %
EOS (ABSOLUTE): 0.2 10*3/uL (ref 0.0–0.4)
Eos: 3 %
Hematocrit: 50.3 % (ref 37.5–51.0)
Hemoglobin: 17.2 g/dL (ref 13.0–17.7)
Immature Grans (Abs): 0.1 10*3/uL (ref 0.0–0.1)
Immature Granulocytes: 1 %
Lymphocytes Absolute: 2.8 10*3/uL (ref 0.7–3.1)
Lymphs: 39 %
MCH: 29.9 pg (ref 26.6–33.0)
MCHC: 34.2 g/dL (ref 31.5–35.7)
MCV: 88 fL (ref 79–97)
Monocytes Absolute: 0.5 10*3/uL (ref 0.1–0.9)
Monocytes: 7 %
Neutrophils Absolute: 3.5 10*3/uL (ref 1.4–7.0)
Neutrophils: 49 %
Platelets: 311 10*3/uL (ref 150–450)
RBC: 5.75 x10E6/uL (ref 4.14–5.80)
RDW: 14.2 % (ref 11.6–15.4)
WBC: 7.2 10*3/uL (ref 3.4–10.8)

## 2024-03-02 LAB — CMP14+EGFR
ALT: 33 IU/L (ref 0–44)
AST: 38 IU/L (ref 0–40)
Albumin: 4.9 g/dL (ref 4.3–5.2)
Alkaline Phosphatase: 62 IU/L (ref 44–121)
BUN/Creatinine Ratio: 10 (ref 9–20)
BUN: 11 mg/dL (ref 6–20)
Bilirubin Total: 0.3 mg/dL (ref 0.0–1.2)
CO2: 21 mmol/L (ref 20–29)
Calcium: 9.8 mg/dL (ref 8.7–10.2)
Chloride: 101 mmol/L (ref 96–106)
Creatinine, Ser: 1.13 mg/dL (ref 0.76–1.27)
Globulin, Total: 3.1 g/dL (ref 1.5–4.5)
Glucose: 86 mg/dL (ref 70–99)
Potassium: 4.7 mmol/L (ref 3.5–5.2)
Sodium: 139 mmol/L (ref 134–144)
Total Protein: 8 g/dL (ref 6.0–8.5)
eGFR: 90 mL/min/{1.73_m2} (ref >59)

## 2024-03-02 LAB — LIPID PANEL
Chol/HDL Ratio: 7.6 ratio — ABNORMAL HIGH (ref 0.0–5.0)
Cholesterol, Total: 280 mg/dL — ABNORMAL HIGH (ref 100–199)
HDL: 37 mg/dL — ABNORMAL LOW (ref >39)
LDL Chol Calc (NIH): 199 mg/dL — ABNORMAL HIGH (ref 0–99)
Triglycerides: 227 mg/dL — ABNORMAL HIGH (ref 0–149)
VLDL Cholesterol Cal: 44 mg/dL — ABNORMAL HIGH (ref 5–40)

## 2024-03-02 LAB — URIC ACID: Uric Acid: 10.8 mg/dL — ABNORMAL HIGH (ref 3.8–8.4)

## 2024-03-03 ENCOUNTER — Other Ambulatory Visit (INDEPENDENT_AMBULATORY_CARE_PROVIDER_SITE_OTHER): Payer: Self-pay | Admitting: Primary Care

## 2024-03-03 ENCOUNTER — Encounter (INDEPENDENT_AMBULATORY_CARE_PROVIDER_SITE_OTHER): Payer: Self-pay | Admitting: Primary Care

## 2024-03-03 DIAGNOSIS — E782 Mixed hyperlipidemia: Secondary | ICD-10-CM

## 2024-03-03 MED ORDER — ROSUVASTATIN CALCIUM 40 MG PO TABS: 40.0000 mg | ORAL_TABLET | Freq: Every day | ORAL | 3 refills | Status: AC

## 2024-03-04 ENCOUNTER — Telehealth (INDEPENDENT_AMBULATORY_CARE_PROVIDER_SITE_OTHER): Payer: Self-pay | Admitting: Licensed Clinical Social Worker

## 2024-03-04 ENCOUNTER — Encounter (INDEPENDENT_AMBULATORY_CARE_PROVIDER_SITE_OTHER): Payer: Self-pay

## 2024-03-04 NOTE — Telephone Encounter (Signed)
 [employer] Intern called patient today to [employer] Intern was able to speak with the patient about his Grief patient stated that he has experienced a lot of loss recently wants to speak to her therapist about it since [employer] Intern to let patient know that and will connect him with a consultation therapist and he will receive counseling resources as well via my chart. patient was referred by PCP for Grief.

## 2024-03-09 ENCOUNTER — Telehealth (INDEPENDENT_AMBULATORY_CARE_PROVIDER_SITE_OTHER): Payer: Self-pay

## 2024-03-09 NOTE — Telephone Encounter (Signed)
 Contacted pt and made aware that FMLA is ready and he will need to pay the $25 form fee. Pt states he will be up here before front desk leave to pay.

## 2024-05-01 ENCOUNTER — Ambulatory Visit
Admission: RE | Admit: 2024-05-01 | Discharge: 2024-05-01 | Disposition: A | Discharge status: Home / Self Care | Payer: Self-pay | Source: Ambulatory Visit | Attending: Family Medicine | Admitting: Family Medicine

## 2024-05-01 ENCOUNTER — Ambulatory Visit (INDEPENDENT_AMBULATORY_CARE_PROVIDER_SITE_OTHER)

## 2024-05-01 VITALS — BP 153/88 | HR 61 | Temp 98.7°F | Resp 18

## 2024-05-01 DIAGNOSIS — M25521 Pain in right elbow: Secondary | ICD-10-CM

## 2024-05-01 DIAGNOSIS — M7031 Other bursitis of elbow, right elbow: Secondary | ICD-10-CM | POA: Diagnosis not present

## 2024-05-01 MED ORDER — PREDNISONE 10 MG (21) PO TBPK: ORAL_TABLET | Freq: Every day | ORAL | 0 refills | Status: AC

## 2024-05-01 NOTE — Discharge Instructions (Signed)
 You may use the Ace wrap to help with swelling and pain.  Start prednisone  taper as prescribed.  You may continue Tylenol  over-the-counter as needed.  Continue ice as needed.  Follow-up with your PCP in 2 to 3 days for recheck.  Please go to the ER for any worsening symptoms.  Hope you feel better soon!

## 2024-05-01 NOTE — ED Triage Notes (Signed)
 Pt present with rt elbow swelling and pain x one week. Pt states he has a hx of gout is unsure if the swelling is due to gout. He hit his elbow against the well last week.

## 2024-05-01 NOTE — ED Provider Notes (Addendum)
 [facility]    CSN: [id number] Arrival date & time: 05/01/24  1123      History   Chief Complaint Chief Complaint  Patient presents with   Elbow Pain    Swollen on my elbow that's making me in pain - Entered by patient    HPI Jeff Vega is a 28 y.o. male presents for elbow pain.  Patient reports a week ago he hit his right elbow accidentally.  States he had some pain at the time but since then has been having increasing pain and swelling of the elbow.  Endorses reduced range of motion as well.  No numbness or tingling.  Does have a history of gout but states he is only had it in his toe.  No fevers or chills.  He has been treated with ice and Advil  with some improvement.  No other concerns at this time.  HPI  History reviewed. No pertinent past medical history.  Patient Active Problem List   Diagnosis Date Noted   Gouty arthritis of left great toe 06/21/2021   Elevated blood pressure reading in office without diagnosis of hypertension 06/21/2021   Class 1 obesity due to excess calories with body mass index (BMI) of 32.0 to 32.9 in adult 06/21/2021    Past Surgical History:  Procedure Laterality Date   ADENOIDECTOMY     TONSILLECTOMY         Home Medications    Prior to Admission medications   Medication Sig Start Date End Date Taking? Authorizing Provider  predniSONE  (STERAPRED UNI-PAK 21 TAB) 10 MG (21) TBPK tablet Take by mouth daily. Take 6 tabs by mouth daily  for 1 day, then 5 tabs for 1 day, then 4 tabs for 1 day, then 3 tabs for 1 day, 2 tabs for 1 day, then 1 tab by mouth daily for 1 days 05/01/24  Yes Fransico Sciandra, Jodi R, NP  allopurinol  (ZYLOPRIM ) 300 MG tablet Take 1 tablet (300 mg total) by mouth daily. 03/01/24   Marius Siemens, NP  cetirizine  (ZYRTEC ) 10 MG tablet Take 1 tablet (10 mg total) by mouth daily. Patient not taking: Reported on 03/01/2024 12/20/20   Marius Siemens, NP  colchicine  0.6 MG tablet Take two tablets (1.2 mg) at the onset of  a gout attack and may repeat 1 tab (0.6 mg) after 1 hour if symptoms persist. 10/24/23   [name], PA-C  ibuprofen  (ADVIL ) 600 MG tablet Take 1 tablet (600 mg total) by mouth every 8 (eight) hours as needed. Patient not taking: Reported on 03/01/2024 10/26/22   Yevette Hem, FNP  rosuvastatin  (CRESTOR ) 40 MG tablet Take 1 tablet (40 mg total) by mouth daily. 03/03/24   Marius Siemens, NP  dicyclomine  (BENTYL ) 10 MG capsule Take 1 tab every 6 hours as needed for abdominal cramping. 12/26/19 01/25/20  Jobe Mulder, DO    Family History Family History  Problem Relation Age of Onset   Diabetes Father    Healthy Mother     Social History Social History   Tobacco Use   Smoking status: Never   Smokeless tobacco: Never  Vaping Use   Vaping status: Never Used  Substance Use Topics   Alcohol use: No   Drug use: No     Allergies   Patient has no known allergies.   Review of Systems Review of Systems  Musculoskeletal:        Right elbow pain      Physical Exam  Triage Vital Signs ED Triage Vitals  Encounter Vitals Group     BP 05/01/24 1139 (!) 153/88     Systolic BP Percentile --      Diastolic BP Percentile --      Pulse Rate 05/01/24 1139 61     Resp 05/01/24 1139 18     Temp 05/01/24 1139 98.7 F (37.1 C)     Temp Source 05/01/24 1139 Oral     SpO2 05/01/24 1139 96 %     Weight --      Height --      Head Circumference --      Peak Flow --      Pain Score 05/01/24 1138 8     Pain Loc --      Pain Education --      Exclude from Growth Chart --    No data found.  Updated Vital Signs BP (!) 153/88 (BP Location: Right Arm)   Pulse 61   Temp 98.7 F (37.1 C) (Oral)   Resp 18   SpO2 96%   Visual Acuity Right Eye Distance:   Left Eye Distance:   Bilateral Distance:    Right Eye Near:   Left Eye Near:    Bilateral Near:     Physical Exam Vitals and nursing note reviewed.  Constitutional:      General: He is not in acute  distress.    Appearance: Normal appearance. He is not ill-appearing.  HENT:     Head: Normocephalic and atraumatic.  Eyes:     Pupils: Pupils are equal, round, and reactive to light.  Cardiovascular:     Rate and Rhythm: Normal rate.  Pulmonary:     Effort: Pulmonary effort is normal.  Musculoskeletal:     Right elbow: Swelling present. No deformity or lacerations. Decreased range of motion. Tenderness present in radial head, medial epicondyle, lateral epicondyle and olecranon process.     Comments: Moderate swelling of the right elbow.  No erythema or warmth.  Skin is intact.  Skin:    General: Skin is warm and dry.  Neurological:     General: No focal deficit present.     Mental Status: He is alert and oriented to person, place, and time.  Psychiatric:        Mood and Affect: Mood normal.        Behavior: Behavior normal.      UC Treatments / Results  Labs (all labs ordered are listed, but only abnormal results are displayed) Labs Reviewed - No data to display  CMP14+EGFR Order: [id number]  Status: Final result     Next appt: 06/01/2024 at 03:30 PM in Family Medicine (Marius Siemens, NP)     Dx: Acute idiopathic gout involving toe o...   Test Result Released: Yes (seen)     Messages: Seen   0 Result Notes     1 Patient Communication     View Follow-Up Encounter       Component Ref Range & Units (hover) 2 mo ago (03/01/24) 9 yr ago (03/19/15) 9 yr ago (07/20/14) 11 yr ago (03/06/13)  Glucose 86 124 High  87 75  BUN 11 8 R 13 R 11 R  Creatinine, Ser 1.13 1.14 R 1.20 R 1.06 R  eGFR 90     BUN/Creatinine Ratio 10     Sodium 139 139 R 139 R 139 R  Potassium 4.7 4.8 R 4.0 R 3.9 R  Chloride 101 103 R 104  R 98 R  CO2 21 29 R  25 R  Calcium  9.8 9.8 R  10.4 R  Total Protein 8.0 8.1 R  9.3 High  R  Albumin 4.9 4.7 R  4.6 R  Globulin, Total 3.1     Bilirubin Total 0.3 1.2 R  0.6 R  Alkaline Phosphatase 62 54 R  79 R  AST 38 49 High  R  19 R  ALT 33 17 R  13 R   Resulting Agency [facility]         Narrative Performed by: Trenia Fritter Performed at:  [address], [place], [place]  [id number] Lab Director: Pearlean Botts MD, Phone:  [phone]  Specimen Collected: 03/01/24 00:00 Last Resulted: 03/02/24 05:35    EKG   Radiology DG Elbow Complete Right Result Date: 05/01/2024 CLINICAL DATA:  Hit elbow 1 week ago.  Pain and swelling. EXAM: RIGHT ELBOW - COMPLETE 3+ VIEW COMPARISON:  None Available. FINDINGS: No joint effusion identified. No signs of acute fracture or dislocation. No significant arthropathy. Superficial soft tissue swelling overlying the posterior and medial aspects of the elbow. IMPRESSION: 1. No acute fracture or dislocation. 2. Posterior and medial soft tissue swelling. Electronically Signed   By: Kimberley Penman M.D.   On: 05/01/2024 12:08    Procedures Procedures (including critical care time)  Medications Ordered in UC Medications - No data to display  Initial Impression / Assessment and Plan / UC Course  I have reviewed the triage vital signs and the nursing notes.  Pertinent labs & imaging results that were available during my care of the patient were reviewed by me and considered in my medical decision making (see chart for details).     Reviewed exam and symptoms with patient.  No red flags.  Discussed bursitis.  No signs of infection or gout.  Will treat with prednisone  taper.  Ace wrap applied and discussed RICE therapy.  Advised PCP follow-up 2 to 3 days for recheck.  ER precautions reviewed and patient verbalized understanding. Final Clinical Impressions(s) / UC Diagnoses   Final diagnoses:  Right elbow pain  Bursitis of right elbow, unspecified bursa     Discharge Instructions      You may use the Ace wrap to help with swelling and pain.  Start prednisone  taper as prescribed.  You may continue Tylenol  over-the-counter as needed.  Continue ice as  needed.  Follow-up with your PCP in 2 to 3 days for recheck.  Please go to the ER for any worsening symptoms.  Hope you feel better soon!     ED Prescriptions     Medication Sig Dispense Auth. Provider   predniSONE  (STERAPRED UNI-PAK 21 TAB) 10 MG (21) TBPK tablet Take by mouth daily. Take 6 tabs by mouth daily  for 1 day, then 5 tabs for 1 day, then 4 tabs for 1 day, then 3 tabs for 1 day, 2 tabs for 1 day, then 1 tab by mouth daily for 1 days 21 tablet Mirayah Wren, Jodi R, NP      PDMP not reviewed this encounter.   Alleen Arbour, NP 05/01/24 1229    Alleen Arbour, NP 05/01/24 1230

## 2024-05-22 ENCOUNTER — Telehealth: Admitting: Family Medicine

## 2024-05-22 DIAGNOSIS — M10071 Idiopathic gout, right ankle and foot: Secondary | ICD-10-CM | POA: Diagnosis not present

## 2024-05-22 DIAGNOSIS — M109 Gout, unspecified: Secondary | ICD-10-CM | POA: Diagnosis not present

## 2024-05-22 MED ORDER — COLCHICINE 0.6 MG PO TABS: ORAL_TABLET | ORAL | 0 refills | Status: DC

## 2024-05-22 NOTE — Patient Instructions (Signed)
Gout  Gout is a condition that causes painful swelling of the joints. Gout is a type of inflammation of the joints (arthritis). This condition is caused by having too much uric acid in the body. Uric acid is a chemical that forms when the body breaks down substances called purines. Purines are important for building body proteins. When the body has too much uric acid, sharp crystals can form and build up inside the joints. This causes pain and swelling. Gout attacks can happen quickly and may be very painful (acute gout). Over time, the attacks can affect more joints and become more frequent (chronic gout). Gout can also cause uric acid to build up under the skin and inside the kidneys. What are the causes? This condition is caused by too much uric acid in your blood. This can happen because: Your kidneys do not remove enough uric acid from your blood. This is the most common cause. Your body makes too much uric acid. This can happen with some cancers and cancer treatments. It can also occur if your body is breaking down too many red blood cells (hemolytic anemia). You eat too many foods that are high in purines. These foods include organ meats and some seafood. Alcohol, especially beer, is also high in purines. A gout attack may be triggered by trauma or stress. What increases the risk? The following factors may make you more likely to develop this condition: Having a family history of gout. Being male and middle-aged. Being male and having gone through menopause. Taking certain medicines, including aspirin, cyclosporine, diuretics, levodopa, and niacin. Having an organ transplant. Having certain conditions, such as: Being obese. Lead poisoning. Kidney disease. A skin condition called psoriasis. Other factors include: Losing weight too quickly. Being dehydrated. Frequently drinking alcohol, especially beer. Frequently drinking beverages that are sweetened with a type of sugar called  fructose. What are the signs or symptoms? An attack of acute gout happens quickly. It usually occurs in just one joint. The most common place is the big toe. Attacks often start at night. Other joints that may be affected include joints of the feet, ankle, knee, fingers, wrist, or elbow. Symptoms of this condition may include: Severe pain. Warmth. Swelling. Stiffness. Tenderness. The affected joint may be very painful to touch. Shiny, red, or purple skin. Chills and fever. Chronic gout may cause symptoms more frequently. More joints may be involved. You may also have white or yellow lumps (tophi) on your hands or feet or in other areas near your joints. How is this diagnosed? This condition is diagnosed based on your symptoms, your medical history, and a physical exam. You may have tests, such as: Blood tests to measure uric acid levels. Removal of joint fluid with a thin needle (aspiration) to look for uric acid crystals. X-rays to look for joint damage. How is this treated? Treatment for this condition has two phases: treating an acute attack and preventing future attacks. Acute gout treatment may include medicines to reduce pain and swelling, including: NSAIDs, such as ibuprofen. Steroids. These are strong anti-inflammatory medicines that can be taken by mouth (orally) or injected into a joint. Colchicine. This medicine relieves pain and swelling when it is taken soon after an attack. It can be given by mouth or through an IV. Preventive treatment may include: Daily use of smaller doses of NSAIDs or colchicine. Use of a medicine that reduces uric acid levels in your blood, such as allopurinol. Changes to your diet. You may need to see   a dietitian about what to eat and drink to prevent gout. Follow these instructions at home: During a gout attack  If directed, put ice on the affected area. To do this: Put ice in a plastic bag. Place a towel between your skin and the bag. Leave the  ice on for 20 minutes, 2-3 times a day. Remove the ice if your skin turns bright red. This is very important. If you cannot feel pain, heat, or cold, you have a greater risk of damage to the area. Raise (elevate) the affected joint above the level of your heart as often as possible. Rest the joint as much as possible. If the affected joint is in your leg, you may be given crutches to use. Follow instructions from your health care provider about eating or drinking restrictions. Avoiding future gout attacks Follow a low-purine diet as told by your dietitian or health care provider. Avoid foods and drinks that are high in purines, including liver, kidney, anchovies, asparagus, herring, mushrooms, mussels, and beer. Maintain a healthy weight or lose weight if you are overweight. If you want to lose weight, talk with your health care provider. Do not lose weight too quickly. Start or maintain an exercise program as told by your health care provider. Eating and drinking Avoid drinking beverages that contain fructose. Drink enough fluids to keep your urine pale yellow. If you drink alcohol: Limit how much you have to: 0-1 drink a day for women who are not pregnant. 0-2 drinks a day for men. Know how much alcohol is in a drink. In the [place] one drink equals one 12 oz bottle of beer (355 mL), one 5 oz glass of wine (148 mL), or one 1 oz glass of hard liquor (44 mL). General instructions Take over-the-counter and prescription medicines only as told by your health care provider. Ask your health care provider if the medicine prescribed to you requires you to avoid driving or using machinery. Return to your normal activities as told by your health care provider. Ask your health care provider what activities are safe for you. Keep all follow-up visits. This is important. Where to find more information National Institutes of Health: www.niams.nih.gov Contact a health care provider if you have: Another  gout attack. Continuing symptoms of a gout attack after 10 days of treatment. Side effects from your medicines. Chills or a fever. Burning pain when you urinate. Pain in your lower back or abdomen. Get help right away if you: Have severe or uncontrolled pain. Cannot urinate. Summary Gout is painful swelling of the joints caused by having too much uric acid in the body. The most common site for gout to occur is in the big toe, but it can affect other joints in the body. Medicines and dietary changes can help to prevent and treat gout attacks. This information is not intended to replace advice given to you by your health care provider. Make sure you discuss any questions you have with your health care provider. Document Revised: 09/19/2021 Document Reviewed: 09/19/2021 Elsevier Patient Education  2024 Elsevier Inc.  

## 2024-05-22 NOTE — Progress Notes (Signed)
 Virtual Visit Consent   Jeff Vega, you are scheduled for a virtual visit with a [facility] provider today. Just as with appointments in the office, your consent must be obtained to participate. Your consent will be active for this visit and any virtual visit you may have with one of our providers in the next 365 days. If you have a MyChart account, a copy of this consent can be sent to you electronically.  As this is a virtual visit, video technology does not allow for your provider to perform a traditional examination. This may limit your provider's ability to fully assess your condition. If your provider identifies any concerns that need to be evaluated in person or the need to arrange testing (such as labs, EKG, etc.), we will make arrangements to do so. Although advances in technology are sophisticated, we cannot ensure that it will always work on either your end or our end. If the connection with a video visit is poor, the visit may have to be switched to a telephone visit. With either a video or telephone visit, we are not always able to ensure that we have a secure connection.  By engaging in this virtual visit, you consent to the provision of healthcare and authorize for your insurance to be billed (if applicable) for the services provided during this visit. Depending on your insurance coverage, you may receive a charge related to this service.  I need to obtain your verbal consent now. Are you willing to proceed with your visit today? Jeff Vega has provided verbal consent on 05/22/2024 for a virtual visit (video or telephone). Jeff Huger, FNP  Date: 05/22/2024 12:52 PM   Virtual Visit via Video Note   I, Jeff Vega, connected with  Jeff Vega  ([id number], 10/12/1996) on 05/22/24 at 12:45 PM EDT by a video-enabled telemedicine application and verified that I am speaking with the correct person using two identifiers.  Location: Patient: Virtual Visit Location Patient:  Home Provider: Virtual Visit Location Provider: Home Office   I discussed the limitations of evaluation and management by telemedicine and the availability of in person appointments. The patient expressed understanding and agreed to proceed.    History of Present Illness: Jeff Vega is a 28 y.o. who identifies as a male who was assigned male at birth, and is being seen today for flare of gout in several joint but now worse in rt great toe. Colchicine  has worked well in the past. .  HPI: HPI  Problems:  Patient Active Problem List   Diagnosis Date Noted   Gouty arthritis of left great toe 06/21/2021   Elevated blood pressure reading in office without diagnosis of hypertension 06/21/2021   Class 1 obesity due to excess calories with body mass index (BMI) of 32.0 to 32.9 in adult 06/21/2021    Allergies: No Known Allergies Medications:  Current Outpatient Medications:    allopurinol  (ZYLOPRIM ) 300 MG tablet, Take 1 tablet (300 mg total) by mouth daily., Disp: 90 tablet, Rfl: 1   cetirizine  (ZYRTEC ) 10 MG tablet, Take 1 tablet (10 mg total) by mouth daily. (Patient not taking: Reported on 03/01/2024), Disp: 30 tablet, Rfl: 11   colchicine  0.6 MG tablet, Take two tablets (1.2 mg) at the onset of a gout attack and may repeat 1 tab (0.6 mg) after 1 hour if symptoms persist., Disp: 30 tablet, Rfl: 1   ibuprofen  (ADVIL ) 600 MG tablet, Take 1 tablet (600 mg total) by mouth every 8 (eight) hours as needed. (Patient  not taking: Reported on 03/01/2024), Disp: 30 tablet, Rfl: 0   predniSONE  (STERAPRED UNI-PAK 21 TAB) 10 MG (21) TBPK tablet, Take by mouth daily. Take 6 tabs by mouth daily  for 1 day, then 5 tabs for 1 day, then 4 tabs for 1 day, then 3 tabs for 1 day, 2 tabs for 1 day, then 1 tab by mouth daily for 1 days, Disp: 21 tablet, Rfl: 0   rosuvastatin  (CRESTOR ) 40 MG tablet, Take 1 tablet (40 mg total) by mouth daily., Disp: 90 tablet, Rfl: 3  Observations/Objective: Patient is well-developed,  well-nourished in no acute distress.  Resting comfortably  at home.  Head is normocephalic, atraumatic.  No labored breathing.  Speech is clear and coherent with logical content.  Patient is alert and oriented at baseline.    Assessment and Plan: 1. Acute gout involving toe of right foot, unspecified cause (Primary)  2. Acute idiopathic gout involving toe of right foot  Elevate, tylenol , follow up as planned with pcp.   Follow Up Instructions: I discussed the assessment and treatment plan with the patient. The patient was provided an opportunity to ask questions and all were answered. The patient agreed with the plan and demonstrated an understanding of the instructions.  A copy of instructions were sent to the patient via MyChart unless otherwise noted below.     The patient was advised to call back or seek an in-person evaluation if the symptoms worsen or if the condition fails to improve as anticipated.    Teckla Christiansen, FNP

## 2024-05-26 ENCOUNTER — Telehealth: Admitting: Physician Assistant

## 2024-05-26 ENCOUNTER — Ambulatory Visit (HOSPITAL_COMMUNITY)

## 2024-05-26 DIAGNOSIS — M109 Gout, unspecified: Secondary | ICD-10-CM

## 2024-05-26 NOTE — Patient Instructions (Signed)
  Jeff Vega, thank you for joining Hyla Maillard, PA-C for today's virtual visit.  While this provider is not your primary care provider (PCP), if your PCP is located in our provider database this encounter information will be shared with them immediately following your visit.   A [facility] MyChart account gives you access to today's visit and all your visits, tests, and labs performed at [facility] " click here if you don't have a [facility] MyChart account or go to [email or web address]  Consent: (Patient) Thanh Mottern provided verbal consent for this virtual visit at the beginning of the encounter.  Current Medications:  Current Outpatient Medications:    allopurinol  (ZYLOPRIM ) 300 MG tablet, Take 1 tablet (300 mg total) by mouth daily., Disp: 90 tablet, Rfl: 1   cetirizine  (ZYRTEC ) 10 MG tablet, Take 1 tablet (10 mg total) by mouth daily. (Patient not taking: Reported on 03/01/2024), Disp: 30 tablet, Rfl: 11   colchicine  0.6 MG tablet, Take two tablets (1.2 mg) at the onset of a gout attack and may repeat 1 tab (0.6 mg) after 1 hour if symptoms persist., Disp: 30 tablet, Rfl: 0   ibuprofen  (ADVIL ) 600 MG tablet, Take 1 tablet (600 mg total) by mouth every 8 (eight) hours as needed. (Patient not taking: Reported on 03/01/2024), Disp: 30 tablet, Rfl: 0   predniSONE  (STERAPRED UNI-PAK 21 TAB) 10 MG (21) TBPK tablet, Take by mouth daily. Take 6 tabs by mouth daily  for 1 day, then 5 tabs for 1 day, then 4 tabs for 1 day, then 3 tabs for 1 day, 2 tabs for 1 day, then 1 tab by mouth daily for 1 days, Disp: 21 tablet, Rfl: 0   rosuvastatin  (CRESTOR ) 40 MG tablet, Take 1 tablet (40 mg total) by mouth daily., Disp: 90 tablet, Rfl: 3   Medications ordered in this encounter:  No orders of the defined types were placed in this encounter.    *If you need refills on other medications prior to your next appointment, please contact your pharmacy*  Follow-Up: Call back or seek an  in-person evaluation if the symptoms worsen or if the condition fails to improve as anticipated.  [facility] [phone]  Other Instructions Please use link below to be evaluated in person today.   If you have been instructed to have an in-person evaluation today at a local Urgent Care facility, please use the link below. It will take you to a list of all of our available [facility], including address, phone number and hours of operation. Please do not delay care.  [facility]  If you or a family member do not have a primary care provider, use the link below to schedule a visit and establish care. When you choose a [facility] primary care physician or advanced practice provider, you gain a long-term partner in health. Find a Primary Care Provider  Learn more about [facility]'s in-office and virtual care options: [facility] - Get Care Now

## 2024-05-26 NOTE — Progress Notes (Signed)
 Virtual Visit Consent   [name], you are scheduled for a virtual visit with a [facility] provider today. Just as with appointments in the office, your consent must be obtained to participate. Your consent will be active for this visit and any virtual visit you may have with one of our providers in the next 365 days. If you have a MyChart account, a copy of this consent can be sent to you electronically.  As this is a virtual visit, video technology does not allow for your provider to perform a traditional examination. This may limit your provider's ability to fully assess your condition. If your provider identifies any concerns that need to be evaluated in person or the need to arrange testing (such as labs, EKG, etc.), we will make arrangements to do so. Although advances in technology are sophisticated, we cannot ensure that it will always work on either your end or our end. If the connection with a video visit is poor, the visit may have to be switched to a telephone visit. With either a video or telephone visit, we are not always able to ensure that we have a secure connection.  By engaging in this virtual visit, you consent to the provision of healthcare and authorize for your insurance to be billed (if applicable) for the services provided during this visit. Depending on your insurance coverage, you may receive a charge related to this service.  I need to obtain your verbal consent now. Are you willing to proceed with your visit today? Jeff Vega has provided verbal consent on 05/26/2024 for a virtual visit (video or telephone). Hyla Maillard, [place]  Date: 05/26/2024 8:41 AM   Virtual Visit via Video Note   I, Hyla Maillard, connected with  Jeff Vega  ([id number], September 01, 1996) on 05/26/24 at  8:15 AM EDT by a video-enabled telemedicine application and verified that I am speaking with the correct person using two identifiers.  Location: Patient: Virtual Visit Location  Patient: Home Provider: Virtual Visit Location Provider: Home Office   I discussed the limitations of evaluation and management by telemedicine and the availability of in person appointments. The patient expressed understanding and agreed to proceed.    History of Present Illness: Jeff Vega is a 28 y.o. who identifies as a male who was assigned male at birth, and is being seen today for continued pain and swelling of R foot, currently treated for suspected gout with colchicine , but notes symptoms are not improving. Denies fever, chills. Contacted PCP for evaluation but cannot see him for another week.   HPI: HPI  Problems:  Patient Active Problem List   Diagnosis Date Noted   Gouty arthritis of left great toe 06/21/2021   Elevated blood pressure reading in office without diagnosis of hypertension 06/21/2021   Class 1 obesity due to excess calories with body mass index (BMI) of 32.0 to 32.9 in adult 06/21/2021    Allergies: No Known Allergies Medications:  Current Outpatient Medications:    allopurinol  (ZYLOPRIM ) 300 MG tablet, Take 1 tablet (300 mg total) by mouth daily., Disp: 90 tablet, Rfl: 1   cetirizine  (ZYRTEC ) 10 MG tablet, Take 1 tablet (10 mg total) by mouth daily. (Patient not taking: Reported on 03/01/2024), Disp: 30 tablet, Rfl: 11   colchicine  0.6 MG tablet, Take two tablets (1.2 mg) at the onset of a gout attack and may repeat 1 tab (0.6 mg) after 1 hour if symptoms persist., Disp: 30 tablet, Rfl: 0   ibuprofen  (ADVIL ) 600  MG tablet, Take 1 tablet (600 mg total) by mouth every 8 (eight) hours as needed. (Patient not taking: Reported on 03/01/2024), Disp: 30 tablet, Rfl: 0   predniSONE  (STERAPRED UNI-PAK 21 TAB) 10 MG (21) TBPK tablet, Take by mouth daily. Take 6 tabs by mouth daily  for 1 day, then 5 tabs for 1 day, then 4 tabs for 1 day, then 3 tabs for 1 day, 2 tabs for 1 day, then 1 tab by mouth daily for 1 days, Disp: 21 tablet, Rfl: 0   rosuvastatin  (CRESTOR ) 40 MG tablet,  Take 1 tablet (40 mg total) by mouth daily., Disp: 90 tablet, Rfl: 3  Observations/Objective: Patient is well-developed, well-nourished in no acute distress.  Resting comfortably  at home.  Head is normocephalic, atraumatic.  No labored breathing.  Speech is clear and coherent with logical content.  Patient is alert and oriented at baseline.   Assessment and Plan: 1. Acute gout involving toe of right foot, unspecified cause (Primary)  Ongoing. Not responding to colchicine . As such requires in person evaluation for examination and rule out of other causes of symptoms/next steps in treatment. He agrees to be evaluated this morning.   Follow Up Instructions: I discussed the assessment and treatment plan with the patient. The patient was provided an opportunity to ask questions and all were answered. The patient agreed with the plan and demonstrated an understanding of the instructions.  A copy of instructions were sent to the patient via MyChart unless otherwise noted below.   The patient was advised to call back or seek an in-person evaluation if the symptoms worsen or if the condition fails to improve as anticipated.    Hyla Maillard, PA-C

## 2024-06-01 ENCOUNTER — Ambulatory Visit (INDEPENDENT_AMBULATORY_CARE_PROVIDER_SITE_OTHER): Admitting: Primary Care

## 2024-06-07 ENCOUNTER — Telehealth: Admitting: Physician Assistant

## 2024-06-07 DIAGNOSIS — M10071 Idiopathic gout, right ankle and foot: Secondary | ICD-10-CM

## 2024-06-07 MED ORDER — COLCHICINE 0.6 MG PO TABS
ORAL_TABLET | ORAL | 0 refills | Status: DC
Start: 2024-06-07 — End: 2024-09-15

## 2024-06-07 NOTE — Patient Instructions (Signed)
 Jeff Vega, thank you for joining [name], PA-C for today's virtual visit.  While this provider is not your primary care provider (PCP), if your PCP is located in our provider database this encounter information will be shared with them immediately following your visit.   A [facility] account gives you access to today's visit and all your visits, tests, and labs performed at [facility] " click here if you don't have a Jeff Vega account or go to [email or web address]  Consent: (Patient) Jeff Vega provided verbal consent for this virtual visit at the beginning of the encounter.  Current Medications:  Current Outpatient Medications:    allopurinol  (ZYLOPRIM ) 300 MG tablet, Take 1 tablet (300 mg total) by mouth daily., Disp: 90 tablet, Rfl: 1   cetirizine  (ZYRTEC ) 10 MG tablet, Take 1 tablet (10 mg total) by mouth daily. (Patient not taking: Reported on 03/01/2024), Disp: 30 tablet, Rfl: 11   colchicine  0.6 MG tablet, Take two tablets (1.2 mg) at the onset of a gout attack and may repeat 1 tab (0.6 mg) after 1 hour if symptoms persist., Disp: 30 tablet, Rfl: 0   ibuprofen  (ADVIL ) 600 MG tablet, Take 1 tablet (600 mg total) by mouth every 8 (eight) hours as needed. (Patient not taking: Reported on 03/01/2024), Disp: 30 tablet, Rfl: 0   predniSONE  (STERAPRED UNI-PAK 21 TAB) 10 MG (21) TBPK tablet, Take by mouth daily. Take 6 tabs by mouth daily  for 1 day, then 5 tabs for 1 day, then 4 tabs for 1 day, then 3 tabs for 1 day, 2 tabs for 1 day, then 1 tab by mouth daily for 1 days, Disp: 21 tablet, Rfl: 0   rosuvastatin  (CRESTOR ) 40 MG tablet, Take 1 tablet (40 mg total) by mouth daily., Disp: 90 tablet, Rfl: 3   Medications ordered in this encounter:  Meds ordered this encounter  Medications   colchicine  0.6 MG tablet    Sig: Take two tablets (1.2 mg) at the onset of a gout attack and may repeat 1 tab (0.6 mg) after 1 hour if symptoms persist.     Dispense:  30 tablet    Refill:  0    Supervising Provider:   LAMPTEY, PHILIP O [phone]     *If you need refills on other medications prior to your next appointment, please contact your pharmacy*  Follow-Up: Call back or seek an in-person evaluation if the symptoms worsen or if the condition fails to improve as anticipated.  [facility] [phone]  Other Instructions  Low-Purine Eating Plan A low-purine eating plan involves making food choices to limit your purine intake. Purine is a kind of uric acid. Too much uric acid in your blood can cause certain conditions, such as gout and kidney stones. Eating a low-purine diet may help control these conditions. What are tips for following this plan? Shopping Avoid buying products that contain high-fructose corn syrup. Check for this on food labels. It is commonly found in many processed foods and soft drinks. Be sure to check for it in baked goods such as cookies, canned fruits, and cereals and cereal bars. Avoid buying veal, chicken breast with skin, lamb, and organ meats such as liver. These types of meats tend to have the highest purine content. Choose dairy products. These may lower uric acid levels. Avoid certain types of fish. Not all fish and seafood have high purine content. Examples with high purine content include anchovies, trout, tuna, sardines, and salmon.  Avoid buying beverages that contain alcohol, particularly beer and hard liquor. Alcohol can affect the way your body gets rid of uric acid. Meal planning  Learn which foods do or do not affect you. If you find out that a food tends to cause your gout symptoms to flare up, avoid eating that food. You can enjoy foods that do not cause problems. If you have any questions about a food item, talk with your dietitian or health care provider. Reduce the overall amount of meat in your diet. When you do eat meat, choose ones with lower purine content. Include plenty of  fruits and vegetables. Although some vegetables may have a high purine content--such as asparagus, mushrooms, spinach, or cauliflower--it has been shown that these do not contribute to uric acid blood levels as much. Consume at least 1 dairy serving a day. This has been shown to decrease uric acid levels. General information If you drink alcohol: Limit how much you have to: 0-1 drink a day for women who are not pregnant. 0-2 drinks a day for men. Know how much alcohol is in a drink. In the [place] one drink equals one 12 oz bottle of beer (355 mL), one 5 oz glass of wine (148 mL), or one 1 oz glass of hard liquor (44 mL). Drink plenty of water. Try to drink enough to keep your urine pale yellow. Fluids can help remove uric acid from your body. Work with your health care provider and dietitian to develop a plan to achieve or maintain a healthy weight. Losing weight may help reduce uric acid in your blood. What foods are recommended? The following are some types of foods that are good choices when limiting purine intake: Fresh or frozen fruits and vegetables. Whole grains, breads, cereals, and pasta. Rice. Beans, peas, legumes. Nuts and seeds. Dairy products. Fats and oils. The items listed above may not be a complete list. Talk with a dietitian about what dietary choices are best for you. What foods are not recommended? Limit your intake of foods high in purines, including: Beer and other alcohol. Meat-based gravy or sauce. Canned or fresh fish, such as: Anchovies, sardines, herring, salmon, and tuna. Mussels and scallops. Codfish, trout, and haddock. Bacon, veal, chicken breast with skin, and lamb. Organ meats, such as: Liver or kidney. Tripe. Sweetbreads (thymus gland or pancreas). Wild Education officer, environmental. Yeast or yeast extract supplements. Drinks sweetened with high-fructose corn syrup, such as soda. Processed foods made with high-fructose corn syrup. The items listed above may not  be a complete list of foods and beverages you should limit. Contact a dietitian for more information. Summary Eating a low-purine diet may help control conditions caused by too much uric acid in the body, such as gout or kidney stones. Choose low-purine foods, limit alcohol, and limit high-fructose corn syrup. You will learn over time which foods do or do not affect you. If you find out that a food tends to cause your gout symptoms to flare up, avoid eating that food. This information is not intended to replace advice given to you by your health care provider. Make sure you discuss any questions you have with your health care provider. Document Revised: 11/29/2021 Document Reviewed: 11/29/2021 Elsevier Patient Education  [employer].   If you have been instructed to have an in-person evaluation today at a local Urgent Care facility, please use the link below. It will take you to a list of all of our available [facility],  including address, phone number and hours of operation. Please do not delay care.  [facility]  If you or a family member do not have a primary care provider, use the link below to schedule a visit and establish care. When you choose a [facility] primary care physician or advanced practice provider, you gain a long-term partner in health. Find a Primary Care Provider  Learn more about [facility] in-office and virtual care options: [facility] - Get Care Now

## 2024-06-07 NOTE — Progress Notes (Signed)
 Virtual Visit Consent   Delante Amsler, you are scheduled for a virtual visit with a [facility] provider today. Just as with appointments in the office, your consent must be obtained to participate. Your consent will be active for this visit and any virtual visit you may have with one of our providers in the next 365 days. If you have a MyChart account, a copy of this consent can be sent to you electronically.  As this is a virtual visit, video technology does not allow for your provider to perform a traditional examination. This may limit your provider's ability to fully assess your condition. If your provider identifies any concerns that need to be evaluated in person or the need to arrange testing (such as labs, EKG, etc.), we will make arrangements to do so. Although advances in technology are sophisticated, we cannot ensure that it will always work on either your end or our end. If the connection with a video visit is poor, the visit may have to be switched to a telephone visit. With either a video or telephone visit, we are not always able to ensure that we have a secure connection.  By engaging in this virtual visit, you consent to the provision of healthcare and authorize for your insurance to be billed (if applicable) for the services provided during this visit. Depending on your insurance coverage, you may receive a charge related to this service.  I need to obtain your verbal consent now. Are you willing to proceed with your visit today? Sewell Pitner has provided verbal consent on 06/07/2024 for a virtual visit (video or telephone). [name], PA-C  Date: 06/07/2024 7:03 PM   Virtual Visit via Video Note   I, [name], connected with  Maurie Olesen  ([id number], 1996/11/23) on 06/07/24 at  6:45 PM EDT by a video-enabled telemedicine application and verified that I am speaking with the correct person using two identifiers.  Location: Patient: Virtual Visit Location Patient:  Home Provider: Virtual Visit Location Provider: Home Office   I discussed the limitations of evaluation and management by telemedicine and the availability of in person appointments. The patient expressed understanding and agreed to proceed.    History of Present Illness: Jeff Vega is a 28 y.o. who identifies as a male who was assigned male at birth, and is being seen today for acute, recurrent gout flare. He was seen Virtually on 05/22/24 and given Colchicine . He was seen again on 05/26/24, virtually, and advised to be seen in person since he had failed treatment. There is no documentation of being seen in person.   He was recently started on Allopurinol  300mg  about 2-3 weeks ago.   Since starting Allopurinol  he has had an increase in the gout flares and is currently having a flare in both of his feet. He also mentions pain in his right elbow.  He has also recently been treated in person at [facility] on 05/01/24 with a 6 day Prednisone  taper for elbow bursitis.   Family history of gout in his MGM, mother had mild episodes but infrequent.  Uric Acid went from 10.0 to 10.8. Allopurinol  started by PCP, but he only recently picked it up to begin.  He has an appt to see his PCP on 06/15/24.   Problems:  Patient Active Problem List   Diagnosis Date Noted   Gouty arthritis of left great toe 06/21/2021   Elevated blood pressure reading in office without diagnosis of hypertension 06/21/2021   Class 1  obesity due to excess calories with body mass index (BMI) of 32.0 to 32.9 in adult 06/21/2021    Allergies: No Known Allergies Medications:  Current Outpatient Medications:    allopurinol  (ZYLOPRIM ) 300 MG tablet, Take 1 tablet (300 mg total) by mouth daily., Disp: 90 tablet, Rfl: 1   cetirizine  (ZYRTEC ) 10 MG tablet, Take 1 tablet (10 mg total) by mouth daily. (Patient not taking: Reported on 03/01/2024), Disp: 30 tablet, Rfl: 11   colchicine  0.6 MG tablet, Take two tablets (1.2 mg) at the onset of a  gout attack and may repeat 1 tab (0.6 mg) after 1 hour if symptoms persist., Disp: 30 tablet, Rfl: 0   ibuprofen  (ADVIL ) 600 MG tablet, Take 1 tablet (600 mg total) by mouth every 8 (eight) hours as needed. (Patient not taking: Reported on 03/01/2024), Disp: 30 tablet, Rfl: 0   predniSONE  (STERAPRED UNI-PAK 21 TAB) 10 MG (21) TBPK tablet, Take by mouth daily. Take 6 tabs by mouth daily  for 1 day, then 5 tabs for 1 day, then 4 tabs for 1 day, then 3 tabs for 1 day, 2 tabs for 1 day, then 1 tab by mouth daily for 1 days, Disp: 21 tablet, Rfl: 0   rosuvastatin  (CRESTOR ) 40 MG tablet, Take 1 tablet (40 mg total) by mouth daily., Disp: 90 tablet, Rfl: 3  Observations/Objective: Patient is well-developed, well-nourished in no acute distress.  Resting comfortably at home.  Head is normocephalic, atraumatic.  No labored breathing.  Speech is clear and coherent with logical content.  Patient is alert and oriented at baseline.    Assessment and Plan: 1. Acute idiopathic gout involving toe of right foot - colchicine  0.6 MG tablet; Take two tablets (1.2 mg) at the onset of a gout attack and may repeat 1 tab (0.6 mg) after 1 hour if symptoms persist.  Dispense: 30 tablet; Refill: 0  - Discussion about diet habits for gout, info provided in AVS - Continue Allopurinol  - Colchicine  added for acute flare - Can take tylenol  as needed for pain - Elevate feet when at rest - Keep scheduled follow up with PCP - Seek in person evaluation sooner if needed for worsening symptoms  Follow Up Instructions: I discussed the assessment and treatment plan with the patient. The patient was provided an opportunity to ask questions and all were answered. The patient agreed with the plan and demonstrated an understanding of the instructions.  A copy of instructions were sent to the patient via MyChart unless otherwise noted below.    The patient was advised to call back or seek an in-person evaluation if the symptoms  worsen or if the condition fails to improve as anticipated.    [name], PA-C

## 2024-06-11 ENCOUNTER — Ambulatory Visit (INDEPENDENT_AMBULATORY_CARE_PROVIDER_SITE_OTHER): Admitting: Primary Care

## 2024-06-11 ENCOUNTER — Encounter (INDEPENDENT_AMBULATORY_CARE_PROVIDER_SITE_OTHER): Payer: Self-pay | Admitting: Primary Care

## 2024-06-11 ENCOUNTER — Ambulatory Visit: Payer: Self-pay

## 2024-06-11 VITALS — BP 138/89 | HR 79 | Resp 16 | Wt 253.8 lb

## 2024-06-11 DIAGNOSIS — M10071 Idiopathic gout, right ankle and foot: Secondary | ICD-10-CM | POA: Diagnosis not present

## 2024-06-11 DIAGNOSIS — R195 Other fecal abnormalities: Secondary | ICD-10-CM

## 2024-06-11 DIAGNOSIS — M109 Gout, unspecified: Secondary | ICD-10-CM

## 2024-06-11 NOTE — Progress Notes (Signed)
 [facility]  [name], is a 28 y.o. male  RDW:[id number]  FMW:[id number]  DOB - 03-14-1996  Chief Complaint  Patient presents with   Stool Color Change       Subjective:   [name] is a 28 y.o. male here today for an acute visit.  He has a history of gouty arthritis.  He is in today for a flareup that started last week in his right elbows, right ankle then traveled to right big toe.  He had a prescription for allopurinol  and never filled it.  He started talking with people that also had problems with gout and told him how well allopurinol  worked.  So patient went and picked up his prescription.  He has taken the medication for 1 week.  Now concern that today his stools are watery and black questioning is just a normal side effect. Yes but will r/o bleed in stool.  No problems updated.  Comprehensive ROS Pertinent positive and negative noted in HPI   No Known Allergies  No past medical history on file.  Current Outpatient Medications on File Prior to Visit  Medication Sig Dispense Refill   allopurinol  (ZYLOPRIM ) 300 MG tablet Take 1 tablet (300 mg total) by mouth daily. 90 tablet 1   cetirizine  (ZYRTEC ) 10 MG tablet Take 1 tablet (10 mg total) by mouth daily. (Patient not taking: Reported on 03/01/2024) 30 tablet 11   colchicine  0.6 MG tablet Take two tablets (1.2 mg) at the onset of a gout attack and may repeat 1 tab (0.6 mg) after 1 hour if symptoms persist. 30 tablet 0   ibuprofen  (ADVIL ) 600 MG tablet Take 1 tablet (600 mg total) by mouth every 8 (eight) hours as needed. (Patient not taking: Reported on 03/01/2024) 30 tablet 0   predniSONE  (STERAPRED UNI-PAK 21 TAB) 10 MG (21) TBPK tablet Take by mouth daily. Take 6 tabs by mouth daily  for 1 day, then 5 tabs for 1 day, then 4 tabs for 1 day, then 3 tabs for 1 day, 2 tabs for 1 day, then 1 tab by mouth daily for 1 days 21 tablet 0   rosuvastatin  (CRESTOR ) 40 MG tablet Take 1 tablet (40 mg total) by mouth  daily. 90 tablet 3   [DISCONTINUED] dicyclomine  (BENTYL ) 10 MG capsule Take 1 tab every 6 hours as needed for abdominal cramping. 60 capsule 0   No current facility-administered medications on file prior to visit.   Health Maintenance  Topic Date Due   HPV Vaccine (1 - Male 3-dose series) Never done   HIV Screening  Never done   Hepatitis C Screening  Never done   COVID-19 Vaccine (1 - 2024-25 season) Never done   Flu Shot  07/30/2024   DTaP/Tdap/Td vaccine (2 - Td or Tdap) 06/26/2028   Meningitis B Vaccine  Aged Out    Objective:   Vitals:   06/11/24 1156  BP: 138/89  Pulse: 79  Resp: 16  SpO2: 97%  Weight: 253 lb 12.8 oz (115.1 kg)   BP Readings from Last 3 Encounters:  06/11/24 138/89  05/01/24 (!) 153/88  03/01/24 130/78      Physical Exam Vitals reviewed.  Constitutional:      Appearance: He is obese.  HENT:     Head: Normocephalic.     Right Ear: Tympanic membrane and external ear normal.     Left Ear: Tympanic membrane and external ear normal.     Nose: Nose normal.   Eyes:  Extraocular Movements: Extraocular movements intact.     Pupils: Pupils are equal, round, and reactive to light.    Cardiovascular:     Rate and Rhythm: Normal rate and regular rhythm.  Pulmonary:     Effort: Pulmonary effort is normal.     Breath sounds: Normal breath sounds.  Abdominal:     General: Bowel sounds are normal. There is distension.     Palpations: Abdomen is soft.   Musculoskeletal:        General: Normal range of motion.   Skin:    General: Skin is warm and dry.   Neurological:     Mental Status: He is oriented to person, place, and time.   Psychiatric:        Mood and Affect: Mood normal.        Behavior: Behavior normal.        Thought Content: Thought content normal.        Judgment: Judgment normal.     Assessment & Plan  Jamason was seen today for stool color change.  Diagnoses and all orders for this visit:   Heme positive stool -      Ambulatory referral to Gastroenterology  Gouty arthritis 2/2 Acute idiopathic gout involving toe of right foot On 06/07/24 prescribed colchicine  0.6 # 30 for acute attack -     allopurinol  (ZYLOPRIM ) 300 MG tablet; Take 1 tablet (300 mg total) by mouth daily.       Patient have been counseled extensively about nutrition and exercise. Other issues discussed during this visit include: low cholesterol diet, weight control and daily exercise, foot care, annual eye examinations at Ophthalmology, importance of adherence with medications and regular follow-up. We also discussed long term complications of uncontrolled diabetes and hypertension.   No follow-ups on file.  The patient was given clear instructions to go to ER or return to medical center if symptoms don't improve, worsen or new problems develop. The patient verbalized understanding. The patient was told to call to get lab results if they haven't heard anything in the next week.   This note has been created with Education officer, environmental. Any transcriptional errors are unintentional.   Rosaline SHAUNNA Bohr, NP 06/11/2024, 12:05 PM

## 2024-06-11 NOTE — Telephone Encounter (Signed)
 FYI Only or Action Required?: FYI only for provider  Patient was last seen in primary care on 05/22/2024 by Blair, Diane W, FNP. Called Nurse Triage reporting Stool Color Change. Symptoms began today. Interventions attempted: Nothing. Symptoms are: unchanged.  Triage Disposition: See PCP Within 2 Weeks  Patient/caregiver understands and will follow disposition?: Yes  Message from [place] R sent at 06/11/2024  9:10 AM EDT  Copied From CRM [id number]. Reason for Triage: Pt says he takes colchicine  0.6 MG tablet for gout, and has noticed his stool is darker than normal this morning. He is requesting to speak with a nurse about it. May need to come in for a sooner appointment as he feels there may be something going on.   Reason for Disposition  NO doctor (or NP/PA) examination for rectal bleeding in past year  Answer Assessment - Initial Assessment Questions 1. COLOR: What color is it? Is that color in part or all of the stool?     Dark brown 2. ONSET: When was the unusual color first noted?     Today 3. CAUSE: Have you eaten any food or taken any medicine of this color? Note: See listing in Background Information section.      Medication-colchicine  4. OTHER SYMPTOMS: Do you have any other symptoms? (e.g., abdomen pain, diarrhea, jaundice, fever).  Had one instant of mild abdominal pain that has now passed.       Additional info: normally yellowish/brown stool. Today had one dark brown stool, now lighter brown in color.  Answer Assessment - Initial Assessment Questions 1. APPEARANCE of BLOOD: What color is it? Is it passed separately, on the surface of the stool, or mixed in with the stool?      Very dark brown stool, now states black while on call, but brown when wiping 2. AMOUNT: How much blood was passed?      Unsure, none in toilet 3. FREQUENCY: How many times has blood been passed with the stools?      Possibly two 4. ONSET: When was the blood first seen in the  stools? (Days or weeks)      today 5. DIARRHEA: Is there also some diarrhea? If Yes, ask: How many diarrhea stools in the past 24 hours?      yes 6. CONSTIPATION: Do you have constipation? If Yes, ask: How bad is it?     no 7. RECURRENT SYMPTOMS: Have you had blood in your stools before? If Yes, ask: When was the last time? and What happened that time?      no 8. BLOOD THINNERS: Do you take any blood thinners? (e.g., Coumadin/warfarin, Pradaxa/dabigatran, aspirin)     no 9. OTHER SYMPTOMS: Do you have any other symptoms?  (e.g., abdomen pain, vomiting, dizziness, fever)     Earlier today had mild abdominal pain that subsided  Protocols used: Stools - Unusual Color-A-AH, Rectal Bleeding-A-AH

## 2024-06-15 ENCOUNTER — Encounter (INDEPENDENT_AMBULATORY_CARE_PROVIDER_SITE_OTHER): Payer: Self-pay

## 2024-06-15 ENCOUNTER — Ambulatory Visit (INDEPENDENT_AMBULATORY_CARE_PROVIDER_SITE_OTHER): Admitting: Primary Care

## 2024-06-15 DIAGNOSIS — M109 Gout, unspecified: Secondary | ICD-10-CM | POA: Diagnosis not present

## 2024-06-15 DIAGNOSIS — E785 Hyperlipidemia, unspecified: Secondary | ICD-10-CM | POA: Diagnosis not present

## 2024-06-15 DIAGNOSIS — K921 Melena: Secondary | ICD-10-CM | POA: Diagnosis not present

## 2024-06-17 DIAGNOSIS — R194 Change in bowel habit: Secondary | ICD-10-CM | POA: Diagnosis not present

## 2024-06-17 DIAGNOSIS — M109 Gout, unspecified: Secondary | ICD-10-CM | POA: Diagnosis not present

## 2024-06-17 DIAGNOSIS — K921 Melena: Secondary | ICD-10-CM | POA: Diagnosis not present

## 2024-06-28 MED ORDER — ALLOPURINOL 300 MG PO TABS: 300.0000 mg | ORAL_TABLET | Freq: Every day | ORAL | 1 refills | Status: AC

## 2024-07-05 LAB — HEMOCCULT GUIAC POC 1CARD (OFFICE): Fecal Occult Blood, POC: POSITIVE — AB

## 2024-07-05 NOTE — Addendum Note (Signed)
 Addended by: CASIMIR JUVENAL SAUNDERS on: 07/05/2024 02:17 PM   Modules accepted: Orders

## 2024-07-06 ENCOUNTER — Other Ambulatory Visit (INDEPENDENT_AMBULATORY_CARE_PROVIDER_SITE_OTHER): Payer: Self-pay | Admitting: Primary Care

## 2024-07-06 ENCOUNTER — Ambulatory Visit: Payer: Self-pay | Admitting: Primary Care

## 2024-07-06 DIAGNOSIS — M10071 Idiopathic gout, right ankle and foot: Secondary | ICD-10-CM

## 2024-07-06 DIAGNOSIS — R195 Other fecal abnormalities: Secondary | ICD-10-CM

## 2024-07-06 NOTE — Telephone Encounter (Unsigned)
 Copied from CRM [id number]. Topic: Clinical - Medication Refill >> Jul 06, 2024 12:22 PM Nathanel BROCKS wrote: Medication: colchicine  0.6 MG tablet   Has the patient contacted their pharmacy? Yes   This is the patient's preferred pharmacy:  [id number] - [place], [place] - [address] AT [facility] OF [address] [place] [address] Phone: [phone] Fax: [phone]  Is this the correct pharmacy for this prescription? Yes If no, delete pharmacy and type the correct one.   Has the prescription been filled recently? Yes  Is the patient out of the medication? Yes  Has the patient been seen for an appointment in the last year OR does the patient have an upcoming appointment? Yes  Can we respond through MyChart? Yes  Agent: Please be advised that Rx refills may take up to 3 business days. We ask that you follow-up with your pharmacy.

## 2024-07-08 NOTE — Telephone Encounter (Signed)
 Requested medication (s) are due for refill today: yes  Requested medication (s) are on the active medication list: yes  Last refill:  06/08/24  Future visit scheduled: no  Notes to clinic:  Unable to refill per protocol, last refill by another provider. Routing to PCP for approval.     Requested Prescriptions  Pending Prescriptions Disp Refills   colchicine  0.6 MG tablet 30 tablet 0    Sig: Take two tablets (1.2 mg) at the onset of a gout attack and may repeat 1 tab (0.6 mg) after 1 hour if symptoms persist.     Endocrinology:  Gout Agents - colchicine  Passed - 07/08/2024  2:07 PM      Passed - Cr in normal range and within 360 days    Creatinine, Ser  Date Value Ref Range Status  03/01/2024 1.13 0.76 - 1.27 mg/dL Final         Passed - ALT in normal range and within 360 days    ALT  Date Value Ref Range Status  03/01/2024 33 0 - 44 IU/L Final         Passed - AST in normal range and within 360 days    AST  Date Value Ref Range Status  03/01/2024 38 0 - 40 IU/L Final         Passed - Valid encounter within last 12 months    Recent Outpatient Visits           3 weeks ago Heme positive stool   [place] [facility] Celestia Rosaline SQUIBB, NP   4 months ago Encounter to establish care   [place] [facility] Celestia Rosaline SQUIBB, NP   3 years ago Gouty arthritis of left great toe   [place] [facility] Celestia Rosaline SQUIBB, NP   3 years ago Pain of toe of right foot   [place] [facility] Celestia Rosaline SQUIBB, NP   3 years ago Hospital discharge follow-up   [place] [facility] Celestia Rosaline SQUIBB, NP              Passed - CBC within normal limits and completed in the last 12 months    WBC  Date Value Ref Range Status  03/01/2024 7.2 3.4 - 10.8 x10E3/uL Final  03/19/2015 7.4 4.0 - 10.5 K/uL Final   RBC  Date Value Ref Range Status  03/01/2024 5.75 4.14 - 5.80 x10E6/uL  Final  03/19/2015 5.40 4.22 - 5.81 MIL/uL Final   Hemoglobin  Date Value Ref Range Status  03/01/2024 17.2 13.0 - 17.7 g/dL Final   Hematocrit  Date Value Ref Range Status  03/01/2024 50.3 37.5 - 51.0 % Final   MCHC  Date Value Ref Range Status  03/01/2024 34.2 31.5 - 35.7 g/dL Final  96/79/7983 65.3 30.0 - 36.0 g/dL Final   [facility]  Date Value Ref Range Status  03/01/2024 29.9 26.6 - 33.0 pg Final  03/19/2015 29.3 26.0 - 34.0 pg Final   MCV  Date Value Ref Range Status  03/01/2024 88 79 - 97 fL Final   No results found for: PLTCOUNTKUC, LABPLAT, POCPLA RDW  Date Value Ref Range Status  03/01/2024 14.2 11.6 - 15.4 % Final

## 2024-09-15 ENCOUNTER — Encounter (INDEPENDENT_AMBULATORY_CARE_PROVIDER_SITE_OTHER): Payer: Self-pay | Admitting: Primary Care

## 2024-09-15 DIAGNOSIS — M10071 Idiopathic gout, right ankle and foot: Secondary | ICD-10-CM

## 2024-09-15 MED ORDER — COLCHICINE 0.6 MG PO TABS: ORAL_TABLET | ORAL | 0 refills | Status: AC
